# Patient Record
Sex: Female | Born: 1955 | Race: White | Hispanic: No | Marital: Married | Smoking: Never smoker
Health system: Southern US, Community
[De-identification: ages and names within clinical notes are randomized; demographics above are authoritative.]

## PROBLEM LIST (undated history)

## (undated) DIAGNOSIS — I10 Essential (primary) hypertension: Secondary | ICD-10-CM

## (undated) DIAGNOSIS — H539 Unspecified visual disturbance: Secondary | ICD-10-CM

## (undated) DIAGNOSIS — K219 Gastro-esophageal reflux disease without esophagitis: Secondary | ICD-10-CM

## (undated) DIAGNOSIS — E039 Hypothyroidism, unspecified: Secondary | ICD-10-CM

## (undated) DIAGNOSIS — M1990 Unspecified osteoarthritis, unspecified site: Secondary | ICD-10-CM

## (undated) HISTORY — DX: Unspecified visual disturbance: H53.9

## (undated) HISTORY — PX: TONSILLECTOMY: SUR1361

## (undated) HISTORY — PX: OTHER SURGICAL HISTORY: SHX169

---

## 1992-06-27 HISTORY — PX: MANDIBLE SURGERY: SHX707

## 2006-05-10 ENCOUNTER — Ambulatory Visit (HOSPITAL_COMMUNITY): Admission: RE | Admit: 2006-05-10 | Discharge: 2006-05-10 | Payer: Self-pay | Admitting: Obstetrics & Gynecology

## 2007-03-20 ENCOUNTER — Encounter (INDEPENDENT_AMBULATORY_CARE_PROVIDER_SITE_OTHER): Payer: Self-pay | Admitting: Obstetrics & Gynecology

## 2007-03-20 ENCOUNTER — Ambulatory Visit (HOSPITAL_COMMUNITY): Admission: RE | Admit: 2007-03-20 | Discharge: 2007-03-20 | Payer: Self-pay | Admitting: Obstetrics & Gynecology

## 2007-05-17 ENCOUNTER — Encounter: Admission: RE | Admit: 2007-05-17 | Discharge: 2007-05-17 | Payer: Self-pay | Admitting: Obstetrics & Gynecology

## 2007-05-30 ENCOUNTER — Encounter: Admission: RE | Admit: 2007-05-30 | Discharge: 2007-05-30 | Payer: Self-pay | Admitting: Obstetrics & Gynecology

## 2008-06-12 ENCOUNTER — Ambulatory Visit (HOSPITAL_COMMUNITY): Admission: RE | Admit: 2008-06-12 | Discharge: 2008-06-12 | Payer: Self-pay | Admitting: Obstetrics & Gynecology

## 2008-06-27 HISTORY — PX: THYROIDECTOMY, PARTIAL: SHX18

## 2008-10-16 ENCOUNTER — Other Ambulatory Visit: Admission: RE | Admit: 2008-10-16 | Discharge: 2008-10-16 | Payer: Self-pay | Admitting: Interventional Radiology

## 2008-10-16 ENCOUNTER — Encounter: Admission: RE | Admit: 2008-10-16 | Discharge: 2008-10-16 | Payer: Self-pay | Admitting: Otolaryngology

## 2008-10-16 ENCOUNTER — Encounter (INDEPENDENT_AMBULATORY_CARE_PROVIDER_SITE_OTHER): Payer: Self-pay | Admitting: Interventional Radiology

## 2009-01-09 ENCOUNTER — Ambulatory Visit (HOSPITAL_COMMUNITY): Admission: RE | Admit: 2009-01-09 | Discharge: 2009-01-10 | Payer: Self-pay | Admitting: Surgery

## 2009-01-09 ENCOUNTER — Encounter (INDEPENDENT_AMBULATORY_CARE_PROVIDER_SITE_OTHER): Payer: Self-pay | Admitting: Surgery

## 2009-06-02 ENCOUNTER — Encounter: Admission: RE | Admit: 2009-06-02 | Discharge: 2009-06-02 | Payer: Self-pay | Admitting: Surgery

## 2009-11-16 ENCOUNTER — Ambulatory Visit (HOSPITAL_COMMUNITY): Admission: RE | Admit: 2009-11-16 | Discharge: 2009-11-16 | Payer: Self-pay | Admitting: Obstetrics & Gynecology

## 2010-04-08 ENCOUNTER — Ambulatory Visit: Payer: Self-pay | Admitting: Emergency Medicine

## 2010-04-08 DIAGNOSIS — J019 Acute sinusitis, unspecified: Secondary | ICD-10-CM

## 2010-05-31 ENCOUNTER — Encounter: Admission: RE | Admit: 2010-05-31 | Discharge: 2010-05-31 | Payer: Self-pay | Admitting: Obstetrics and Gynecology

## 2010-07-18 ENCOUNTER — Encounter: Payer: Self-pay | Admitting: Obstetrics & Gynecology

## 2010-07-27 NOTE — Assessment & Plan Note (Signed)
Summary: POSSIBLE SINUS INFECTION   Vital Signs:  Patient Profile:   55 Years Old Female CC:      sinus pressure, HA, cough x  O2 treatment:    Room Air (left arm) Cuff size:   regular  Vitals Entered By: Lajean Saver RN (April 08, 2010 10:47 AM)                  Updated Prior Medication List: L-THYROXINE SODIUM  POWD (LEVOTHYROXINE SODIUM)  DAYQUIL MULTI-SYMPTOM 30-325-10 MG/15ML LIQD (PSEUDOEPHEDRINE-APAP-DM) prn NYQUIL 60-7.11-23-998 MG/30ML LIQD (PSEUDOEPH-DOXYLAMINE-DM-APAP)   Current Allergies: ! * CONTRAST DYEHistory of Present Illness Chief Complaint: sinus pressure, HA, cough x  History of Present Illness: Patient complains of onset of cold symptoms for 10 days.  They have been using Dayquil, Nyquil, and Sudafed PE which is helping a little bit.  She was getting better, then got worse again. No sore throat + cough No pleuritic pain No wheezing + nasal congestion + post-nasal drainage + sinus pain/pressure No itchy/red eyes No earache No hemoptysis No SOB + chills/sweats No fever + nausea No vomiting No abdominal pain No diarrhea No skin rashes No fatigue No myalgias No headache   REVIEW OF SYSTEMS Constitutional Symptoms       Complains of fatigue.  Ear/Nose/Throat/Mouth       Complains of frequent runny nose and sinus problems.  Respiratory       Complains of productive cough.     Gastrointestinal       Complains of nausea/vomiting.      Comments: no vomiting Neurological       Complains of headaches.    Past History:  Past Medical History: Unremarkable  Past Surgical History: Partial thyroidectomy Jaw Sx  Family History: None  Social History: Never Smoked Alcohol use-yes once weekly Drug use-no Smoking Status:  never Drug Use:  no Physical Exam General appearance: well developed, well nourished, no acute distress Head: maxillary sinus tenderness Nasal: clear discharge Oral/Pharynx: tongue normal, posterior pharynx  without erythema or exudate Neck: neck supple,  trachea midline, no masses Chest/Lungs: no rales, wheezes, or rhonchi bilateral, breath sounds equal without effort Heart: regular rate and  rhythm, no murmur Skin: no obvious rashes or lesions MSE: oriented to time, place, and person Assessment New Problems: SINUSITIS, ACUTE (ICD-461.9)   Patient Education: Patient and/or caregiver instructed in the following: rest, fluids, Tylenol prn. Sudafed 12-hour  Plan New Medications/Changes: AMOXICILLIN 875 MG TABS (AMOXICILLIN) 1 tab by mouth two times a day for 10 days  #20 x 0, 04/08/2010, Hoyt Koch MD  New Orders: New Patient Level II [phone] Planning Comments:   Hydration, nasal saline Follow-up with your primary care physician if not improving or if getting worse   The patient and/or caregiver has been counseled thoroughly with regard to medications prescribed including dosage, schedule, interactions, rationale for use, and possible side effects and they verbalize understanding.  Diagnoses and expected course of recovery discussed and will return if not improved as expected or if the condition worsens. Patient and/or caregiver verbalized understanding.  Prescriptions: AMOXICILLIN 875 MG TABS (AMOXICILLIN) 1 tab by mouth two times a day for 10 days  #20 x 0   Entered and Authorized by:   Hoyt Koch MD   Signed by:   Hoyt Koch MD on 04/08/2010   Method used:   Print then Give to Patient   RxID:   [id number]   Orders Added: 1)  New Patient Level II [27253]

## 2010-10-03 LAB — COMPREHENSIVE METABOLIC PANEL
ALT: 13 U/L (ref 0–35)
AST: 16 U/L (ref 0–37)
Albumin: 3.6 g/dL (ref 3.5–5.2)
Alkaline Phosphatase: 39 U/L (ref 39–117)
Calcium: 9.1 mg/dL (ref 8.4–10.5)
Potassium: 3.8 mEq/L (ref 3.5–5.1)
Sodium: 139 mEq/L (ref 135–145)
Total Protein: 6.2 g/dL (ref 6.0–8.3)

## 2010-10-03 LAB — CBC
HCT: 40.2 % (ref 36.0–46.0)
MCV: 91.3 fL (ref 78.0–100.0)
RBC: 4.4 MIL/uL (ref 3.87–5.11)
WBC: 4.8 10*3/uL (ref 4.0–10.5)

## 2010-10-03 LAB — DIFFERENTIAL
Basophils Relative: 0 % (ref 0–1)
Eosinophils Absolute: 0.1 10*3/uL (ref 0.0–0.7)
Lymphs Abs: 1.3 10*3/uL (ref 0.7–4.0)
Monocytes Absolute: 0.3 10*3/uL (ref 0.1–1.0)
Monocytes Relative: 7 % (ref 3–12)
Neutrophils Relative %: 64 % (ref 43–77)

## 2010-11-09 NOTE — Op Note (Signed)
Shelby Brandt                ACCOUNT NO.:  [id number]   MEDICAL RECORD NO.:  [id number]          PATIENT TYPE:  OIB   LOCATION:  1529                         FACILITY:  [facility]   PHYSICIAN:  Sandria Bales. Ezzard Standing, M.D.  DATE OF BIRTH:  1956-03-05   DATE OF PROCEDURE:  01/09/2009  DATE OF DISCHARGE:                               OPERATIVE REPORT   Date of Surgery ?   PREOPERATIVE DIAGNOSIS:  Right thyroid nodule approximately 3 cm in  diameter.   POSTOPERATIVE DIAGNOSIS:  Right thyroid nodule approximately 3 cm in  diameter.  Benign on frozen section.   PROCEDURE:  Right thyroid lobectomy.   SURGEON:  Ovidio Kin, MD.   FIRST ASSISTANT:  Avel Peace, MD.   ANESTHESIA:  General endotracheal.   ESTIMATED BLOOD LOSS:  Minimal.   INDICATIONS FOR PROCEDURE:  Shelby Brandt is a 55 year old white  female who has had a dominant right thyroid nodule that she tried  thyroid suppression, but there is some question of the nodule getting a  little bit bigger.  On a needle aspiration pathology saw follicular  cells and I discussed with the patient about proceeding with removing  this nodule.  Of note, she has a nodule on the opposite thyroid also  which was also biopsied, this nodule is smaller, so we talked about the  options of removing the right side only (unless malignant) or removing  both glands regardless of pathology.  She agreed that if the right  thyroid nodule was benign we would stop there, if it was malignant she  would need a total thyroidectomy.  We would follow the left thyroid  nodule, which remained, with annual ultrasound.   The potential complications of thyroid surgery include, but are not  limited to, bleeding, infection, recurrent laryngeal nerve injury, and,  if she has a total thyroidectomy, the possibility of parathyroid injury  and subsequent calcium problems.   OPERATIVE NOTE:  The patient was placed in the supine position, given a  general  endotracheal anesthetic.  Her neck was prepped with CHG and  sterilely draped.  A time out was held identifying the patient and the  procedure.   An incision was made approximately 2-1/2 cm above her sternum and sharp  dissection carried down, elevating her platysma muscle.  I then opened  her straps through the midline and elevated these off the right thyroid  gland.  There was an approximate 3 cm palpable mass in the mid to lower  pole of the right thyroid gland that was palpable.  There was a lot of  stickiness in the muscle and I think it was just from the prior  biopsies.   I first mobilized the upper pole.  I went around the superior thyroid  vessels, used 3-0 silk ties to tie the upper pole and I placed a clip.  I did this twice to mobilize the upper pole.  I identified a superior  parathyroid gland which was in the capsule of the thyroid.  I thought I  identified the gland and secured it safely, it dropped it away.  I then  took off the inferior thyroid vessels using primarily the Wave Harmonic  scalpel and Bovie cautery with occasional need of a clip or a tie.   After mobilizing up the right lobe out of the right tracheal groove, I  identified the recurrent laryngeal nerve which was posterior to this  dissection.  I carried the thyroid dissection up to the midline and  divided the isthmus.   I had a suture marking the superior pulse, sent this to pathology.  I  discussed with Shelby Brandt the findings at pathology.  He said this has a  benign appearance at this time, the final pathology pending.  I  discussed with him the left thyroid nodule and he thought that limiting  to the right lobe seemed reasonable.  As our original plan, with planned  followup just ultrasound left side.   The bleeding had been controlled with ties over the vessels and  controlled with a wave Harmonic scalpel.  I placed some Surgicel in the  bed of the right thyroid bed.  I irrigated the wound with  saline and  then closed the strap muscles in the midline with interrupted 3-0 Vicryl  sutures.  I closed the platysma with interrupted 3-0 Vicryl sutures, I  closed the skin with a 5-0 running Monocryl suture, painted the wound  with Tincture of Benzoin and steri-stripped it.   The patient tolerated the procedure well.  Final pathology is pending at  the time of this dictation.  Sponge and needle count were correct at the  end of the case.  Will plan to keep her overnight for observation.  Will  discharge tomorrow.      Sandria Bales. Ezzard Standing, M.D.  Electronically Signed     DHN/MEDQ  D:  01/09/2009  T:  01/09/2009  Job:  [id number]   cc:   Marjory Lies, M.D.  Fax: [phone]

## 2010-11-09 NOTE — Op Note (Signed)
NAMECIAIRA, Shelby Brandt NO.:  [id number]   MEDICAL RECORD NO.:  [id number]          PATIENT TYPE:  AMB   LOCATION:  SDC                           FACILITY:  [facility]   PHYSICIAN:  Ilda Mori, M.D.   DATE OF BIRTH:  June 14, 1956   DATE OF PROCEDURE:  03/20/2007  DATE OF DISCHARGE:                               OPERATIVE REPORT   PREOPERATIVE DIAGNOSIS:  Menorrhagia and endometrial polyp.   POSTOPERATIVE DIAGNOSIS:  Menorrhagia and endometrial polyp.   PROCEDURE:  Hysteroscopy, polypectomy and NovaSure endometrial ablation.   SURGEON:  Dr. Ilda Mori.   ANESTHESIA:  Was MAC with paracervical block.   FINDINGS:  Was a 2-cm endometrial polyp.   COMPLICATIONS:  Were none.   ESTIMATED BLOOD LOSS:  Was minimal.   INDICATIONS:  A 55 year old female with a history of heavy vaginal  bleeding.  Office evaluation revealed a 2-cm polyp in the endometrial  cavity.  These findings were discussed with the patient, and decision  was made to proceed with hysteroscopy and polypectomy followed by  NovaSure endometrial ablation.   PROCEDURE:  The patient was taken to the operating room and placed in a  dorsal lithotomy position.  IV sedation was administered, and 10 mL of  2% lidocaine was infiltrated in the paracervical tissues.  The cervix  was sounded with a Hagar dilator and was approximately 3.2 cm.  The  endometrial cavity was sounded to 9.2 cm.  The endocervical os was then  dilated with Shawnie Pons dilators to 25-French.  A diagnostic hysteroscope was  introduced into the endometrial cavity, and a 2-cm polyp was noted to be  present.  No other abnormalities were noted.  The hysteroscope was  removed.  The polyp forceps were introduced, and the polyp was grasped  and removed.  The hysteroscope was reintroduced to confirm that the  polyp had been removed.  At this point, the NovaSure device was  introduced into the endometrial cavity and deployed with a width of 4.8  cm.   An ablation was then carried out at a power of 158 watts for  approximately 58 seconds.  At the end of the procedure, the hysteroscope  was reintroduced, and the entire endometrial cavity was seen to be  ablated.  The procedure was then terminated, and the patient left the  operating room in good condition.      Ilda Mori, M.D.  Electronically Signed    RK/MEDQ  D:  03/20/2007  T:  03/20/2007  Job:  [id number]

## 2010-11-10 ENCOUNTER — Other Ambulatory Visit (HOSPITAL_COMMUNITY): Payer: Self-pay | Admitting: Obstetrics & Gynecology

## 2010-11-10 DIAGNOSIS — Z1231 Encounter for screening mammogram for malignant neoplasm of breast: Secondary | ICD-10-CM

## 2010-11-23 ENCOUNTER — Inpatient Hospital Stay (INDEPENDENT_AMBULATORY_CARE_PROVIDER_SITE_OTHER)
Admission: RE | Admit: 2010-11-23 | Discharge: 2010-11-23 | Disposition: A | Discharge status: Home / Self Care | Payer: PRIVATE HEALTH INSURANCE | Source: Ambulatory Visit | Attending: Emergency Medicine | Admitting: Emergency Medicine

## 2010-11-23 ENCOUNTER — Encounter: Payer: Self-pay | Admitting: Emergency Medicine

## 2010-11-23 DIAGNOSIS — J069 Acute upper respiratory infection, unspecified: Secondary | ICD-10-CM

## 2010-11-23 LAB — CONVERTED CEMR LAB: Rapid Strep: NEGATIVE

## 2010-11-25 ENCOUNTER — Ambulatory Visit (HOSPITAL_COMMUNITY)
Admission: RE | Admit: 2010-11-25 | Discharge: 2010-11-25 | Disposition: A | Discharge status: Home / Self Care | Payer: PRIVATE HEALTH INSURANCE | Source: Ambulatory Visit | Attending: Obstetrics & Gynecology | Admitting: Obstetrics & Gynecology

## 2010-11-25 DIAGNOSIS — Z1231 Encounter for screening mammogram for malignant neoplasm of breast: Secondary | ICD-10-CM

## 2010-12-08 ENCOUNTER — Other Ambulatory Visit: Payer: Self-pay | Admitting: Obstetrics & Gynecology

## 2011-04-07 LAB — CBC
HCT: 36
MCHC: 35.6
MCV: 90.1
Platelets: 221
WBC: 4.7

## 2011-05-30 NOTE — Progress Notes (Signed)
Summary: COUGH,CONGESTION,SORE THROAT/TJ rm 5   Vital Signs:  Patient Profile:   55 Years Old Female CC:      dry cough, RT ear ache, and sore throat x 5 days Weight:      171.50 pounds O2 Sat:      99 % O2 treatment:    Room Air Temp:     98.1 degrees F oral Pulse rate:   74 / minute Resp:     16 per minute BP sitting:   111 / 80  (left arm) Cuff size:   regular  Vitals Entered By: Clemens Catholic LPN (Nov 23, 2010 7:00 PM)                  Updated Prior Medication List: L-THYROXINE SODIUM  POWD (LEVOTHYROXINE SODIUM)  FLONASE 50 MCG/ACT SUSP (FLUTICASONE PROPIONATE)   Current Allergies (reviewed today): ! * CONTRAST DYEHistory of Present Illness History from: patient Chief Complaint: dry cough, RT ear ache, and sore throat x 5 days History of Present Illness: 55 Years Old Female complains of onset of cold symptoms for 7 days.  Cena has been using Sudafed and OTC cough meds which is helping a little bit.  She has been feeling worse the last few days. + sore throat + cough + hoarseness No pleuritic pain No wheezing + nasal congestion + post-nasal drainage + sinus pain/pressure No chest congestion No itchy/red eyes + earache No hemoptysis No SOB No chills/sweats No fever No nausea No vomiting No abdominal pain No diarrhea No skin rashes + fatigue No myalgias + headache   REVIEW OF SYSTEMS Constitutional Symptoms      Denies fever, chills, night sweats, weight loss, weight gain, and fatigue.  Eyes       Complains of contact lenses.      Denies change in vision, eye pain, eye discharge, glasses, and eye surgery. Ear/Nose/Throat/Mouth       Complains of sore throat and hoarseness.      Denies hearing loss/aids, change in hearing, ear pain, ear discharge, dizziness, frequent runny nose, frequent nose bleeds, sinus problems, and tooth pain or bleeding.  Respiratory       Complains of dry cough.      Denies productive cough, wheezing, shortness of breath,  asthma, bronchitis, and emphysema/COPD.  Cardiovascular       Denies murmurs, chest pain, and tires easily with exhertion.    Gastrointestinal       Denies stomach pain, nausea/vomiting, diarrhea, constipation, blood in bowel movements, and indigestion. Genitourniary       Denies painful urination, kidney stones, and loss of urinary control. Neurological       Complains of headaches.      Denies paralysis, seizures, and fainting/blackouts. Musculoskeletal       Denies muscle pain, joint pain, joint stiffness, decreased range of motion, redness, swelling, muscle weakness, and gout.  Skin       Denies bruising, unusual mles/lumps or sores, and hair/skin or nail changes.  Psych       Denies mood changes, temper/anger issues, anxiety/stress, speech problems, depression, and sleep problems. Other Comments: pt c/o dry cough, sore throat, and RT ear ache x 5days. no fever. she has taken Sudafed, IBF and Robt DM with no relief.   Past History:  Past Medical History: Reviewed history from 04/08/2010 and no changes required. Unremarkable  Past Surgical History: Reviewed history from 04/08/2010 and no changes required. Partial thyroidectomy Jaw Sx  Family History: Reviewed history from 04/08/2010  and no changes required. None  Social History: Reviewed history from 04/08/2010 and no changes required. Never Smoked Alcohol use-yes once weekly Drug use-no Physical Exam General appearance: well developed, well nourished, hoarse Ears: normal, no lesions or deformities Nasal: mucosa pink, nonedematous, no septal deviation, turbinates normal Oral/Pharynx: pharyngeal erythema without exudate, uvula midline without deviation Chest/Lungs: no rales, wheezes, or rhonchi bilateral, breath sounds equal without effort Heart: regular rate and  rhythm, no murmur MSE: oriented to time, place, and person Assessment New Problems: UPPER RESPIRATORY INFECTION, ACUTE (ICD-465.9)   Plan New  Medications/Changes: AMOXICILLIN 875 MG TABS (AMOXICILLIN) 1 by mouth two times a day for 7 days  #14 x 0, 11/23/2010, Hoyt Koch MD  Planning Comments:   1)  Take the prescribed antibiotic as instructed. 2)  Use nasal saline solution (over the counter) at least 3 times a day. 3)  Use over the counter decongestants like Zyrtec-D every 12 hours as needed to help with congestion. 4)  Can take tylenol every 6 hours or motrin every 8 hours for pain or fever. 5)  Follow up with your primary doctor  if no improvement in 5-7 days, sooner if increasing pain, fever, or new symptoms.    The patient and/or caregiver has been counseled thoroughly with regard to medications prescribed including dosage, schedule, interactions, rationale for use, and possible side effects and they verbalize understanding.  Diagnoses and expected course of recovery discussed and will return if not improved as expected or if the condition worsens. Patient and/or caregiver verbalized understanding.  Prescriptions: AMOXICILLIN 875 MG TABS (AMOXICILLIN) 1 by mouth two times a day for 7 days  #14 x 0   Entered and Authorized by:   Hoyt Koch MD   Signed by:   Hoyt Koch MD on 11/23/2010   Method used:   Print then Give to Patient   RxID:   [id number]     Laboratory Results  Date/Time Received: Nov 23, 2010 7:04 PM  Date/Time Reported: Nov 23, 2010 7:04 PM   Other Tests  Rapid Strep: negative  Kit Test Internal QC: Negative   (Normal Range: Negative)

## 2012-03-12 ENCOUNTER — Other Ambulatory Visit (HOSPITAL_COMMUNITY): Payer: Self-pay | Admitting: Obstetrics & Gynecology

## 2012-03-12 DIAGNOSIS — Z1231 Encounter for screening mammogram for malignant neoplasm of breast: Secondary | ICD-10-CM

## 2012-03-22 ENCOUNTER — Ambulatory Visit (HOSPITAL_COMMUNITY)
Admission: RE | Admit: 2012-03-22 | Discharge: 2012-03-22 | Disposition: A | Discharge status: Home / Self Care | Payer: PRIVATE HEALTH INSURANCE | Source: Ambulatory Visit | Attending: Obstetrics & Gynecology | Admitting: Obstetrics & Gynecology

## 2012-03-22 DIAGNOSIS — Z1231 Encounter for screening mammogram for malignant neoplasm of breast: Secondary | ICD-10-CM

## 2012-04-19 ENCOUNTER — Telehealth (INDEPENDENT_AMBULATORY_CARE_PROVIDER_SITE_OTHER): Payer: Self-pay

## 2012-04-19 NOTE — Telephone Encounter (Signed)
I reviewed the patient's old chart and Dr Ezzard Standing saw her in January of 2012.  He made that her last visit.  He stated she had stable ultrasounds in 2010 and 2011.  I advised the pt and said she does not need any follow up at this time.

## 2012-04-19 NOTE — Telephone Encounter (Signed)
Message copied by Ivory Broad on Thu Apr 19, 2012  2:31 PM ------      Message from: Rise Paganini      Created: Thu Apr 19, 2012  2:03 PM      Regarding: Lemmie Evens: [phone]       Patient stated that she will would like to know when she should be scheduled for an ultrasound concerning her thyroid. This information was recommended by her primary physician because it seems that she may have missed the time frame of a follow-up. Please call to discuss. Thank you.

## 2012-04-24 ENCOUNTER — Other Ambulatory Visit: Payer: Self-pay | Admitting: Family Medicine

## 2012-04-24 DIAGNOSIS — E041 Nontoxic single thyroid nodule: Secondary | ICD-10-CM

## 2012-05-01 ENCOUNTER — Other Ambulatory Visit: Payer: PRIVATE HEALTH INSURANCE

## 2012-05-08 ENCOUNTER — Other Ambulatory Visit: Payer: PRIVATE HEALTH INSURANCE

## 2012-05-15 ENCOUNTER — Ambulatory Visit
Admission: RE | Admit: 2012-05-15 | Discharge: 2012-05-15 | Disposition: A | Discharge status: Home / Self Care | Payer: PRIVATE HEALTH INSURANCE | Source: Ambulatory Visit | Attending: Family Medicine | Admitting: Family Medicine

## 2012-05-15 DIAGNOSIS — E041 Nontoxic single thyroid nodule: Secondary | ICD-10-CM

## 2012-07-24 ENCOUNTER — Other Ambulatory Visit: Payer: Self-pay | Admitting: Family Medicine

## 2012-07-24 DIAGNOSIS — R319 Hematuria, unspecified: Secondary | ICD-10-CM

## 2012-07-24 DIAGNOSIS — R109 Unspecified abdominal pain: Secondary | ICD-10-CM

## 2012-07-26 ENCOUNTER — Other Ambulatory Visit: Payer: PRIVATE HEALTH INSURANCE

## 2012-07-27 ENCOUNTER — Ambulatory Visit
Admission: RE | Admit: 2012-07-27 | Discharge: 2012-07-27 | Disposition: A | Discharge status: Home / Self Care | Payer: PRIVATE HEALTH INSURANCE | Source: Ambulatory Visit | Attending: Family Medicine | Admitting: Family Medicine

## 2012-07-27 DIAGNOSIS — R319 Hematuria, unspecified: Secondary | ICD-10-CM

## 2012-07-27 DIAGNOSIS — R109 Unspecified abdominal pain: Secondary | ICD-10-CM

## 2012-10-22 ENCOUNTER — Other Ambulatory Visit: Payer: Self-pay | Admitting: Orthopedic Surgery

## 2012-10-22 ENCOUNTER — Encounter (HOSPITAL_COMMUNITY): Payer: Self-pay | Admitting: Pharmacy Technician

## 2012-10-23 ENCOUNTER — Encounter (HOSPITAL_COMMUNITY): Payer: Self-pay

## 2012-10-23 ENCOUNTER — Other Ambulatory Visit (HOSPITAL_COMMUNITY): Payer: Self-pay | Admitting: *Deleted

## 2012-10-23 ENCOUNTER — Encounter (HOSPITAL_COMMUNITY)
Admission: RE | Admit: 2012-10-23 | Discharge: 2012-10-23 | Disposition: A | Discharge status: Home / Self Care | Payer: PRIVATE HEALTH INSURANCE | Source: Ambulatory Visit | Attending: Specialist | Admitting: Specialist

## 2012-10-23 HISTORY — DX: Hypothyroidism, unspecified: E03.9

## 2012-10-23 LAB — CBC
HCT: 40.4 % (ref 36.0–46.0)
Hemoglobin: 14.1 g/dL (ref 12.0–15.0)
RBC: 4.6 MIL/uL (ref 3.87–5.11)
WBC: 5.4 10*3/uL (ref 4.0–10.5)

## 2012-10-23 LAB — SURGICAL PCR SCREEN: Staphylococcus aureus: POSITIVE — AB

## 2012-10-23 NOTE — Patient Instructions (Signed)
20      Your procedure is scheduled on:  Friday 10/26/2012  Report to [facility] at 0630  AM.  Call this number if you have problems the morning of surgery: [phone]   Remember:             IF YOU USE CPAP,BRING MASK AND TUBING AM OF SURGERY!   Do not eat food or drink liquids AFTER MIDNIGHT!  Take these medicines the morning of surgery with A SIP OF WATER: Levothyroxine, use Flonase nasal spray   Do not bring valuables to the hospital.  .  Leave suitcase in the car. After surgery it may be brought to your room.  For patients admitted to the hospital, checkout time is 11:00 AM the day of              Discharge.    DO NOT WEAR JEWELRY , MAKE-UP, LOTIONS,POWDERS,PERFUMES!             WOMEN -DO NOT SHAVE LEGS OR UNDERARMS 12 HRS. BEFORE SURGERY!               MEN MAY SHAVE AS USUAL!             CONTACTS,DENTURES OR BRIDGEWORK, FALSE EYELASHES MAY  NOT BE WORN INTO SURGERY!                                           Patients discharged the day of surgery will not be allowed to drive home.If going home the same day of surgery, must have someone stay with you first 24 hrs.at home and arrange for someone to drive you home from the Hospital.                         YOUR DRIVER IS:Tim-spouse   Special Instructions:             Please read over the following fact sheets that you were given:             1. [facility] PREPARING FOR SURGERY SHEET              2.[facility] INFORMATION              3.[facility]                                        Telford Nab.Terri Rorrer,RN,BSN     [phone]                FAILURE TO FOLLOW THESE INSTRUCTIONS MAY RESULT IN CANCELLATION OF YOUR SURGERY!               Patient Signature:___________________________

## 2012-10-24 ENCOUNTER — Other Ambulatory Visit: Payer: Self-pay | Admitting: Orthopedic Surgery

## 2012-10-25 ENCOUNTER — Other Ambulatory Visit: Payer: Self-pay | Admitting: Orthopedic Surgery

## 2012-10-25 NOTE — Pre-Procedure Instructions (Signed)
Called patient on cell # [phone] and left message of time change for surgery. Patient to report to Short Stay on 10/26/2012 at 0630 am. Left message to call [phone] if have any questions.

## 2012-10-25 NOTE — H&P (Signed)
Shelby Brandt is an 56 y.o. female.   Chief Complaint: right knee pain HPI: c/o R knee pain x 2 months (but worsening x 4 weeks). Symptoms reported today include: pain, swelling, giving way, instability and pain with weightbearing. The patient feels that they are doing poorly. Pain refractory to activity modification, conservative measures, Cortisone injections, ice, NSAIDs, rest. She has tried working on some stretching and quad strengthening with no relief. Now with episodes of instability and giving way.  Past Medical History  Diagnosis Date  . Hypothyroidism     Past Surgical History  Procedure Laterality Date  . Thyroidectomy, partial  2010    right  . Tonsillectomy      as adult  . Mandible surgery  1994    and upper jaw    No family history on file. Social History:  reports that  drinks alcohol. She reports that she does not use illicit drugs. Her tobacco history is not on file.  Allergies:  Allergies  Allergen Reactions  . Ivp Dye (Iodinated Diagnostic Agents) Hives    respiratory problems      (Not in a hospital admission)  No results found for this or any previous visit (from the past 48 hour(s)). No results found.  Review of Systems  Constitutional: Negative.   HENT: Negative.   Eyes: Negative.   Respiratory: Negative.   Cardiovascular: Negative.   Gastrointestinal: Negative.   Genitourinary: Negative.   Musculoskeletal: Positive for back pain and joint pain.  Skin: Negative.   Neurological: Negative.   Psychiatric/Behavioral: Negative.     There were no vitals taken for this visit. Physical Exam  Constitutional: She is oriented to person, place, and time. She appears well-developed and well-nourished. She appears distressed.  HENT:  Head: Normocephalic and atraumatic.  Eyes: Conjunctivae and EOM are normal. Pupils are equal, round, and reactive to light.  Neck: Normal range of motion. Neck supple.  Cardiovascular: Normal rate and regular rhythm.    Respiratory: Effort normal and breath sounds normal.  GI: Soft. Bowel sounds are normal.  Musculoskeletal:  Posterior knee tender to palpation, mid 1/3 of the medial joint line tender to palpation and posterior 1/3 of the medial joint line tender to palpation. Trace effusion. Mild patellofemoral crepitus. Sensation intact to light touch. Skin- no ecchymosis and no erythema. Strength and Tone:Quadriceps- 5/5. Hamstrings- 5/5. AROM:5-100. Passively can extend to 0  with pain. No instability. Equivocal [place]. Antalgic gait.  Neurological: She is alert and oriented to person, place, and time. She has normal reflexes.  Skin: Skin is warm and dry.  Psychiatric: She has a normal mood and affect.    MRI R knee images and report reviewed today by Dr. Beane with degeneration and partial avulsion posterior root medial meniscus with mild peripheral extrusion and small horizontal tear of the body. 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 Tibial ganglion 1.3cm at attachment of posterior root medial meniscus. Small effusion.  Assessment/Plan R knee MMT  Pt with progressive pain, swelling, mechanical symptoms, ambulatory dysfunction, MRI with meniscal tear, symptoms also suspicious for chondral flap tear. Refractory to rest, activity modification, HEP, NSAIDs, ice/elevation, intra-articular steroid injection. At this point, recommend proceeding with R knee arthroscopy, partial medial meniscectomy. Discussed the procedure itself as well as risks, complications, and alternatives including but not limited to DVT, PE, infx, bleeding, failure of procedure, need for secondary procedure, anesthesia risk, even death. All questions answered. Discussed post-op course. Pt desires to proceed. Will schedule for R knee arthroscopy.     BISSELL, JACLYN M. for Dr. Beane 10/25/2012, 9:54 PM    

## 2012-10-26 ENCOUNTER — Encounter (HOSPITAL_COMMUNITY)
Admission: RE | Disposition: A | Discharge status: Home / Self Care | Payer: Self-pay | Source: Ambulatory Visit | Attending: Specialist

## 2012-10-26 ENCOUNTER — Ambulatory Visit (HOSPITAL_COMMUNITY)
Admission: RE | Admit: 2012-10-26 | Discharge: 2012-10-26 | Disposition: A | Discharge status: Home / Self Care | Payer: PRIVATE HEALTH INSURANCE | Source: Ambulatory Visit | Attending: Specialist | Admitting: Specialist

## 2012-10-26 ENCOUNTER — Encounter (HOSPITAL_COMMUNITY): Payer: Self-pay | Admitting: Anesthesiology

## 2012-10-26 ENCOUNTER — Ambulatory Visit (HOSPITAL_COMMUNITY): Payer: PRIVATE HEALTH INSURANCE | Admitting: Anesthesiology

## 2012-10-26 ENCOUNTER — Encounter (HOSPITAL_COMMUNITY): Payer: Self-pay | Admitting: *Deleted

## 2012-10-26 DIAGNOSIS — IMO0002 Reserved for concepts with insufficient information to code with codable children: Secondary | ICD-10-CM | POA: Insufficient documentation

## 2012-10-26 DIAGNOSIS — E890 Postprocedural hypothyroidism: Secondary | ICD-10-CM | POA: Insufficient documentation

## 2012-10-26 DIAGNOSIS — S83249A Other tear of medial meniscus, current injury, unspecified knee, initial encounter: Secondary | ICD-10-CM

## 2012-10-26 DIAGNOSIS — X58XXXA Exposure to other specified factors, initial encounter: Secondary | ICD-10-CM | POA: Insufficient documentation

## 2012-10-26 DIAGNOSIS — M1710 Unilateral primary osteoarthritis, unspecified knee: Secondary | ICD-10-CM | POA: Insufficient documentation

## 2012-10-26 DIAGNOSIS — S83289A Other tear of lateral meniscus, current injury, unspecified knee, initial encounter: Secondary | ICD-10-CM | POA: Insufficient documentation

## 2012-10-26 DIAGNOSIS — S83241S Other tear of medial meniscus, current injury, right knee, sequela: Secondary | ICD-10-CM

## 2012-10-26 DIAGNOSIS — Z91041 Radiographic dye allergy status: Secondary | ICD-10-CM | POA: Insufficient documentation

## 2012-10-26 HISTORY — PX: KNEE ARTHROSCOPY: SHX127

## 2012-10-26 SURGERY — ARTHROSCOPY, KNEE
Anesthesia: General | Site: Knee | Laterality: Right | Wound class: Clean

## 2012-10-26 MED ORDER — FENTANYL CITRATE 0.05 MG/ML IJ SOLN
INTRAMUSCULAR | Status: DC | PRN
  Administered 2012-10-26: 100 ug via INTRAVENOUS

## 2012-10-26 MED ORDER — EPINEPHRINE HCL 1 MG/ML IJ SOLN
INTRAMUSCULAR | Status: AC
  Filled 2012-10-26: qty 2

## 2012-10-26 MED ORDER — METOCLOPRAMIDE HCL 5 MG/ML IJ SOLN
INTRAMUSCULAR | Status: DC | PRN
  Administered 2012-10-26: 10 mg via INTRAVENOUS

## 2012-10-26 MED ORDER — DEXAMETHASONE SODIUM PHOSPHATE 4 MG/ML IJ SOLN
INTRAMUSCULAR | Status: DC | PRN
  Administered 2012-10-26: 8 mg via INTRAVENOUS

## 2012-10-26 MED ORDER — CEFAZOLIN SODIUM-DEXTROSE 2-3 GM-% IV SOLR
2.0000 g | INTRAVENOUS | Status: AC
  Administered 2012-10-26: 2 g via INTRAVENOUS

## 2012-10-26 MED ORDER — PROMETHAZINE HCL 25 MG/ML IJ SOLN: 6.2500 mg | INTRAMUSCULAR | Status: DC | PRN

## 2012-10-26 MED ORDER — CEFAZOLIN SODIUM-DEXTROSE 2-3 GM-% IV SOLR
INTRAVENOUS | Status: AC
  Filled 2012-10-26: qty 50

## 2012-10-26 MED ORDER — NEOSTIGMINE METHYLSULFATE 1 MG/ML IJ SOLN
INTRAMUSCULAR | Status: DC | PRN
  Administered 2012-10-26: 5 mg via INTRAVENOUS

## 2012-10-26 MED ORDER — LACTATED RINGERS IR SOLN
Status: DC | PRN
  Administered 2012-10-26: 6000 mL

## 2012-10-26 MED ORDER — MIDAZOLAM HCL 5 MG/5ML IJ SOLN
INTRAMUSCULAR | Status: DC | PRN
  Administered 2012-10-26: 2 mg via INTRAVENOUS

## 2012-10-26 MED ORDER — ACETAMINOPHEN 10 MG/ML IV SOLN
INTRAVENOUS | Status: AC
  Filled 2012-10-26: qty 100

## 2012-10-26 MED ORDER — ONDANSETRON HCL 4 MG/2ML IJ SOLN
INTRAMUSCULAR | Status: DC | PRN
  Administered 2012-10-26: 4 mg via INTRAVENOUS

## 2012-10-26 MED ORDER — LACTATED RINGERS IV SOLN
INTRAVENOUS | Status: DC
  Administered 2012-10-26: 1000 mL via INTRAVENOUS

## 2012-10-26 MED ORDER — HYDROMORPHONE HCL PF 1 MG/ML IJ SOLN: 0.2500 mg | INTRAMUSCULAR | Status: DC | PRN

## 2012-10-26 MED ORDER — EPINEPHRINE HCL 1 MG/ML IJ SOLN
INTRAMUSCULAR | Status: DC | PRN
  Administered 2012-10-26: 2 mL

## 2012-10-26 MED ORDER — GLYCOPYRROLATE 0.2 MG/ML IJ SOLN
INTRAMUSCULAR | Status: DC | PRN
  Administered 2012-10-26: .6 mg via INTRAVENOUS

## 2012-10-26 MED ORDER — HYDROCODONE-ACETAMINOPHEN 5-325 MG PO TABS: 1.0000 | ORAL_TABLET | Freq: Four times a day (QID) | ORAL | Status: DC | PRN

## 2012-10-26 MED ORDER — CISATRACURIUM BESYLATE (PF) 10 MG/5ML IV SOLN
INTRAVENOUS | Status: DC | PRN
  Administered 2012-10-26: 6 mg via INTRAVENOUS

## 2012-10-26 MED ORDER — CLINDAMYCIN PHOSPHATE 900 MG/50ML IV SOLN
900.0000 mg | INTRAVENOUS | Status: AC
  Administered 2012-10-26: 900 mg via INTRAVENOUS

## 2012-10-26 MED ORDER — KETOROLAC TROMETHAMINE 30 MG/ML IJ SOLN
INTRAMUSCULAR | Status: AC
  Filled 2012-10-26: qty 1

## 2012-10-26 MED ORDER — LIDOCAINE HCL (CARDIAC) 20 MG/ML IV SOLN
INTRAVENOUS | Status: DC | PRN
  Administered 2012-10-26: 75 mg via INTRAVENOUS

## 2012-10-26 MED ORDER — PROPOFOL 10 MG/ML IV EMUL
INTRAVENOUS | Status: DC | PRN
  Administered 2012-10-26: 200 mg via INTRAVENOUS

## 2012-10-26 MED ORDER — BUPIVACAINE-EPINEPHRINE (PF) 0.5% -1:200000 IJ SOLN
INTRAMUSCULAR | Status: AC
  Filled 2012-10-26: qty 10

## 2012-10-26 MED ORDER — LACTATED RINGERS IV SOLN: INTRAVENOUS | Status: DC

## 2012-10-26 MED ORDER — CLINDAMYCIN PHOSPHATE 900 MG/50ML IV SOLN
INTRAVENOUS | Status: AC
  Filled 2012-10-26: qty 50

## 2012-10-26 MED ORDER — KETOROLAC TROMETHAMINE 30 MG/ML IJ SOLN
30.0000 mg | Freq: Once | INTRAMUSCULAR | Status: AC
  Administered 2012-10-26: 30 mg via INTRAVENOUS

## 2012-10-26 MED ORDER — BUPIVACAINE-EPINEPHRINE 0.5% -1:200000 IJ SOLN
INTRAMUSCULAR | Status: DC | PRN
  Administered 2012-10-26: 15 mL

## 2012-10-26 SURGICAL SUPPLY — 21 items
BLADE 4.2CUDA (BLADE) IMPLANT
BLADE CUDA SHAVER 3.5 (BLADE) ×2 IMPLANT
CLOTH 2% CHLOROHEXIDINE 3PK (PERSONAL CARE ITEMS) ×2 IMPLANT
CLOTH BEACON ORANGE TIMEOUT ST (SAFETY) ×2 IMPLANT
DRSG EMULSION OIL 3X3 NADH (GAUZE/BANDAGES/DRESSINGS) ×2 IMPLANT
DURAPREP 26ML APPLICATOR (WOUND CARE) ×2 IMPLANT
GLOVE BIOGEL PI IND STRL 7.5 (GLOVE) ×1 IMPLANT
GLOVE BIOGEL PI IND STRL 8 (GLOVE) ×1 IMPLANT
GLOVE BIOGEL PI INDICATOR 7.5 (GLOVE) ×1
GLOVE BIOGEL PI INDICATOR 8 (GLOVE) ×1
GLOVE SURG SS PI 7.5 STRL IVOR (GLOVE) ×2 IMPLANT
GLOVE SURG SS PI 8.0 STRL IVOR (GLOVE) ×3 IMPLANT
GOWN PREVENTION PLUS LG XLONG (DISPOSABLE) ×1 IMPLANT
GOWN STRL REIN XL XLG (GOWN DISPOSABLE) ×5 IMPLANT
MANIFOLD NEPTUNE II (INSTRUMENTS) ×3 IMPLANT
PACK ARTHROSCOPY WL (CUSTOM PROCEDURE TRAY) ×2 IMPLANT
PADDING CAST COTTON 6X4 STRL (CAST SUPPLIES) ×2 IMPLANT
SET ARTHROSCOPY TUBING (MISCELLANEOUS) ×2
SET ARTHROSCOPY TUBING LN (MISCELLANEOUS) ×1 IMPLANT
SUT ETHILON 4 0 PS 2 18 (SUTURE) ×2 IMPLANT
WRAP KNEE MAXI GEL POST OP (GAUZE/BANDAGES/DRESSINGS) ×2 IMPLANT

## 2012-10-26 NOTE — Anesthesia Preprocedure Evaluation (Signed)
Anesthesia Evaluation  Patient identified by MRN, date of birth, ID band Patient awake    Reviewed: Allergy & Precautions, H&P , NPO status , Patient's Chart, lab work & pertinent test results  Airway Mallampati: II TM Distance: >3 FB Neck ROM: Full    Dental  (+) Teeth Intact and Dental Advisory Given   Pulmonary neg pulmonary ROS,  breath sounds clear to auscultation  Pulmonary exam normal       Cardiovascular negative cardio ROS  Rhythm:Regular Rate:Normal     Neuro/Psych negative neurological ROS  negative psych ROS   GI/Hepatic negative GI ROS, Neg liver ROS,   Endo/Other  negative endocrine ROSHypothyroidism   Renal/GU negative Renal ROS  negative genitourinary   Musculoskeletal negative musculoskeletal ROS (+)   Abdominal   Peds negative pediatric ROS (+)  Hematology negative hematology ROS (+)   Anesthesia Other Findings   Reproductive/Obstetrics negative OB ROS                           Anesthesia Physical Anesthesia Plan  ASA: I  Anesthesia Plan: General   Post-op Pain Management:    Induction: Intravenous  Airway Management Planned: LMA  Additional Equipment:   Intra-op Plan:   Post-operative Plan: Extubation in OR  Informed Consent: I have reviewed the patients History and Physical, chart, labs and discussed the procedure including the risks, benefits and alternatives for the proposed anesthesia with the patient or authorized representative who has indicated his/her understanding and acceptance.   Dental advisory given  Plan Discussed with: CRNA  Anesthesia Plan Comments:         Anesthesia Quick Evaluation

## 2012-10-26 NOTE — Brief Op Note (Signed)
10/26/2012  10:04 AM  PATIENT:  Shelby Brandt  57 y.o. female  PRE-OPERATIVE DIAGNOSIS:  RIGHT KNEE MEDIAL MENISCUS TEAR  POST-OPERATIVE DIAGNOSIS:  RIGHT KNEE MEDIAL AND LATERAL  MENISCUS TEAR  PROCEDURE:  Procedure(s): RIGHT KNEE ARTHROSCOPY WITH PARTIAL MEDIAL AND LATERAL  MENISECTOMY, DEBRIDEMENT (Right)  SURGEON:  Surgeon(s) and Role:    * Javier Docker, MD - Primary  PHYSICIAN ASSISTANT:   ASSISTANTS: Dawayne Cirri ANESTHESIA:   general  EBL:  Total I/O In: 1000 [I.V.:1000] Out: 250 [Other:250]  BLOOD ADMINISTERED:none  DRAINS: none   LOCAL MEDICATIONS USED:  MARCAINE     SPECIMEN:  No Specimen  DISPOSITION OF SPECIMEN:  N/A  COUNTS:  YES  TOURNIQUET:  * No tourniquets in log *  DICTATION: .Other Dictation: Dictation Number [id number]  PLAN OF CARE: Discharge to home after PACU  PATIENT DISPOSITION:  PACU - hemodynamically stable.   Delay start of Pharmacological VTE agent (>24hrs) due to surgical blood loss or risk of bleeding: no

## 2012-10-26 NOTE — Preoperative (Signed)
Beta Blockers   Reason not to administer Beta Blockers:Not Applicable 

## 2012-10-26 NOTE — H&P (View-Only) (Signed)
Shelby Brandt is an 57 y.o. female.   Chief Complaint: right knee pain HPI: c/o R knee pain x 2 months (but worsening x 4 weeks). Symptoms reported today include: pain, swelling, giving way, instability and pain with weightbearing. The patient feels that they are doing poorly. Pain refractory to activity modification, conservative measures, Cortisone injections, ice, NSAIDs, rest. She has tried working on some stretching and quad strengthening with no relief. Now with episodes of instability and giving way.  Past Medical History  Diagnosis Date  . Hypothyroidism     Past Surgical History  Procedure Laterality Date  . Thyroidectomy, partial  2010    right  . Tonsillectomy      as adult  . Mandible surgery  1994    and upper jaw    No family history on file. Social History:  reports that  drinks alcohol. She reports that she does not use illicit drugs. Her tobacco history is not on file.  Allergies:  Allergies  Allergen Reactions  . Ivp Dye (Iodinated Diagnostic Agents) Hives    respiratory problems      (Not in a hospital admission)  No results found for this or any previous visit (from the past 48 hour(s)). No results found.  Review of Systems  Constitutional: Negative.   HENT: Negative.   Eyes: Negative.   Respiratory: Negative.   Cardiovascular: Negative.   Gastrointestinal: Negative.   Genitourinary: Negative.   Musculoskeletal: Positive for back pain and joint pain.  Skin: Negative.   Neurological: Negative.   Psychiatric/Behavioral: Negative.     There were no vitals taken for this visit. Physical Exam  Constitutional: She is oriented to person, place, and time. She appears well-developed and well-nourished. She appears distressed.  HENT:  Head: Normocephalic and atraumatic.  Eyes: Conjunctivae and EOM are normal. Pupils are equal, round, and reactive to light.  Neck: Normal range of motion. Neck supple.  Cardiovascular: Normal rate and regular rhythm.    Respiratory: Effort normal and breath sounds normal.  GI: Soft. Bowel sounds are normal.  Musculoskeletal:  Posterior knee tender to palpation, mid 1/3 of the medial joint line tender to palpation and posterior 1/3 of the medial joint line tender to palpation. Trace effusion. Mild patellofemoral crepitus. Sensation intact to light touch. Skin- no ecchymosis and no erythema. Strength and Tone:Quadriceps- 5/5. Hamstrings- 5/5. AROM:5-100. Passively can extend to 0  with pain. No instability. Equivocal McMurray. Antalgic gait.  Neurological: She is alert and oriented to person, place, and time. She has normal reflexes.  Skin: Skin is warm and dry.  Psychiatric: She has a normal mood and affect.    MRI R knee images and report reviewed today by Dr. Shelle Iron with degeneration and partial avulsion posterior root medial meniscus with mild peripheral extrusion and small horizontal tear of the body. 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 Tibial ganglion 1.3cm at attachment of posterior root medial meniscus. Small effusion.  Assessment/Plan R knee MMT  Pt with progressive pain, swelling, mechanical symptoms, ambulatory dysfunction, MRI with meniscal tear, symptoms also suspicious for chondral flap tear. Refractory to rest, activity modification, HEP, NSAIDs, ice/elevation, intra-articular steroid injection. At this point, recommend proceeding with R knee arthroscopy, partial medial meniscectomy. Discussed the procedure itself as well as risks, complications, and alternatives including but not limited to DVT, PE, infx, bleeding, failure of procedure, need for secondary procedure, anesthesia risk, even death. All questions answered. Discussed post-op course. Pt desires to proceed. Will schedule for R knee arthroscopy.  Andrez Grime M. for Dr. Shelle Iron 10/25/2012, 9:54 PM

## 2012-10-26 NOTE — Anesthesia Postprocedure Evaluation (Signed)
Anesthesia Post Note  Patient: Shelby Brandt  Procedure(s) Performed: Procedure(s) (LRB): RIGHT KNEE ARTHROSCOPY WITH PARTIAL MEDIAL AND LATERAL  MENISECTOMY, DEBRIDEMENT (Right)  Anesthesia type: General  Patient location: PACU  Post pain: Pain level controlled  Post assessment: Post-op Vital signs reviewed  Last Vitals:  Filed Vitals:   10/26/12 1130  BP: 114/83  Pulse: 55  Temp:   Resp: 17    Post vital signs: Reviewed  Level of consciousness: sedated  Complications: No apparent anesthesia complications

## 2012-10-26 NOTE — Interval H&P Note (Signed)
History and Physical Interval Note:  10/26/2012 7:02 AM  Shelby Brandt  has presented today for surgery, with the diagnosis of RIGHT KNEE MEDIAL MENISCUS TEAR  The various methods of treatment have been discussed with the patient and family. After consideration of risks, benefits and other options for treatment, the patient has consented to  Procedure(s): RIGHT KNEE ARTHROSCOPY WITH PARTIAL MEDIAL MENISECTOMY (Right) as a surgical intervention .  The patient's history has been reviewed, patient examined, no change in status, stable for surgery.  I have reviewed the patient's chart and labs.  Questions were answered to the patient's satisfaction.     [name] C

## 2012-10-26 NOTE — Transfer of Care (Signed)
Immediate Anesthesia Transfer of Care Note  Patient: Shelby Brandt  Procedure(s) Performed: Procedure(s): RIGHT KNEE ARTHROSCOPY WITH PARTIAL MEDIAL AND LATERAL  MENISECTOMY, DEBRIDEMENT (Right)  Patient Location: PACU  Anesthesia Type:General  Level of Consciousness: awake, alert  and oriented  Airway & Oxygen Therapy: Patient Spontanous Breathing and Patient connected to face mask oxygen  Post-op Assessment: Report given to PACU RN, Post -op Vital signs reviewed and stable and Patient moving all extremities  Post vital signs: Reviewed and stable  Complications: No apparent anesthesia complications

## 2012-10-27 NOTE — Op Note (Signed)
NAMETANA, TREFRY NO.:  [id number]  MEDICAL RECORD NO.:  [id number]  LOCATION:  [facility]                         FACILITY:  [facility]  PHYSICIAN:  Jene Every, M.D.    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  POSTOPERATIVE DIAGNOSES:  Lateral meniscus tear, grade 3 chondromalacia of patella, medial femoral condyle, medial tibial plateau, lateral tibial plateau __________, right knee arthroscopy, partial medial meniscectomy, chondroplasty of the patella, medial femoral condyle, lateral tibial plateau, partial lateral meniscectomy.  ANESTHESIA:  General.  ASSISTANT:  Lanna Poche, PA-C helped with a valgus positioning to evaluate and address the lateral meniscus.  BRIEF HISTORY:  This is a 57 year old with locking popping giving way. MRI indicating meniscus tear and DJD, back on conservative treatment including rest, activity modification, corticosteroid injection, quad strengthening, indicated for arthroscopic debridement.  Risks and benefits were discussed including bleeding, infection, damage to neurovascular structures, DVT, PE, and anesthetic complications, need for repeat arthroscopy for injections in the future.  TECHNIQUE:  With the patient in supine position, after induction of adequate general anesthesia, 2 g of Kefzol, the right lower extremity was prepped and draped in usual sterile fashion.  A lateral parapatellar portal was fashioned with an #11 blade.  The Ingress cannula atraumatically placed.  Irrigant was utilized to insufflate the joint. Under direct visualization, a medial parapatellar portal was fashioned with a #11 blade after localization with 18-gauge needle sparing the medial meniscus.  __________ medial compartment with grade 3 changes  of femoral condyle, tibial plateau, posterior third of the medial meniscus tear.  The light chondroplasty was performed with femoral condyle and tibial plateau with 3-5 shaver.  We used a straight basket and resected 1/3 of the posterior third at the inner edge.  This was resected to a stable base, further contoured with 3-5 shaver.  The remnant was stable to probe palpation.  No grade 4 changes.  ACL was unremarkable.  Lateral compartmental tear __________ lateral meniscus.  There was grade 3 changes of the lateral tibial plateau on the medial aspect near the tibial spine.  I shaved the lateral meniscus to a stable base.  Light chondroplasty, tibial plateau, no grade 4 changes.  Here the remnant was stable to probe palpation.  Suprapatellar pouch revealed some grade 3 changes of the patella into the sulcus minor.  There was normal patellofemoral tracking.  Gutters were unremarkable.  Light chondroplasty performed of the patella and the sulcus.  All the compartments revisited.  No further pathology amenable to arthroscopic intervention.  I, therefore, removed all instrumentation and portals.  Portals were closed with 4-0 nylon simple sutures, 0.25% Marcaine with epinephrine.  __________ wound dressed sterilely.  Awoken without difficulty and transported to the recovery room in satisfactory condition.  The patient tolerated the procedure well with no complications.  No blood loss.     Jene Every, M.D.     Cordelia Pen  D:  10/26/2012  T:  10/27/2012  Job:  [id number]

## 2012-10-29 ENCOUNTER — Encounter (HOSPITAL_COMMUNITY): Payer: Self-pay | Admitting: Specialist

## 2013-02-11 ENCOUNTER — Emergency Department (INDEPENDENT_AMBULATORY_CARE_PROVIDER_SITE_OTHER): Payer: PRIVATE HEALTH INSURANCE

## 2013-02-11 ENCOUNTER — Emergency Department (INDEPENDENT_AMBULATORY_CARE_PROVIDER_SITE_OTHER)
Admission: EM | Admit: 2013-02-11 | Discharge: 2013-02-11 | Disposition: A | Discharge status: Home / Self Care | Payer: PRIVATE HEALTH INSURANCE | Source: Home / Self Care | Attending: Family Medicine | Admitting: Family Medicine

## 2013-02-11 DIAGNOSIS — R350 Frequency of micturition: Secondary | ICD-10-CM

## 2013-02-11 LAB — POCT URINALYSIS DIP (MANUAL ENTRY)
Glucose, UA: NEGATIVE
Spec Grav, UA: >=1.03 (ref 1.005–1.03)

## 2013-02-11 MED ORDER — CEPHALEXIN 500 MG PO CAPS: 500.0000 mg | ORAL_CAPSULE | Freq: Three times a day (TID) | ORAL | Status: DC

## 2013-02-11 NOTE — ED Provider Notes (Signed)
CSN: [id number]     Arrival date & time 02/11/13  1733 History     First MD Initiated Contact with Patient 02/11/13 1743     Chief Complaint  Patient presents with  . Urinary Frequency    x 2 days    HPI  DYSURIA Onset:  2 days  Description: increased urinary frequency, low back pain Modifying factors: prior hx/o kidney stones, no hematuria. Sxs milder in comparison to prior kidney stones.  Symptoms Urgency:  yes Frequency: yes  Hesitancy:  yes Hematuria:  no Flank Pain:  Mild R sided  Fever: no Nausea/Vomiting:  no Missed LMP: no STD exposure: no Discharge: no Irritants: no Rash: no  Red Flags   More than 3 UTI's last 12 months:  no PMH of  Diabetes or Immunosuppression:  no Renal Disease/Calculi: no Urinary Tract Abnormality:  no Instrumentation or Trauma: no    Past Medical History  Diagnosis Date  . Hypothyroidism    Past Surgical History  Procedure Laterality Date  . Thyroidectomy, partial  2010    right  . Tonsillectomy      as adult  . Mandible surgery  1994    and upper jaw  . Knee arthroscopy Right 10/26/2012    Procedure: RIGHT KNEE ARTHROSCOPY WITH PARTIAL MEDIAL AND LATERAL  MENISECTOMY, DEBRIDEMENT;  Surgeon: Javier Docker, MD;  Location: [facility];  Service: Orthopedics;  Laterality: Right;   History reviewed. No pertinent family history. History  Substance Use Topics  . Smoking status: Never Smoker   . Smokeless tobacco: Never Used  . Alcohol Use: Yes     Comment: occassionally   OB History   Grav Para Term Preterm Abortions TAB SAB Ect Mult Living                 Review of Systems  All other systems reviewed and are negative.    Allergies  Ivp dye  Home Medications   Current Outpatient Rx  Name  Route  Sig  Dispense  Refill  . levothyroxine (SYNTHROID, LEVOTHROID) 75 MCG tablet   Oral   Take 75 mcg by mouth daily before breakfast.         . cephALEXin (KEFLEX) 500 MG capsule   Oral   Take 1 capsule (500 mg total)  by mouth 3 (three) times daily.   21 capsule   0   . cholecalciferol (VITAMIN D) 1000 UNITS tablet   Oral   Take 1,000 Units by mouth daily.         . fluticasone (FLONASE) 50 MCG/ACT nasal spray   Nasal   Place 2 sprays into the nose 2 (two) times daily.         Marland Kitchen HYDROcodone-acetaminophen (NORCO) 5-325 MG per tablet   Oral   Take 1-2 tablets by mouth every 6 (six) hours as needed for pain.   30 tablet   0   . Lutein 20 MG TABS   Oral   Take 20 mg by mouth daily.         . magnesium gluconate (MAGONATE) 500 MG tablet   Oral   Take 500 mg by mouth 2 (two) times daily.          BP 125/79  Pulse 72  Temp(Src) 98.2 F (36.8 C) (Oral)  Ht 5\' 8"  (1.727 m)  Wt 175 lb (79.379 kg)  BMI 26.61 kg/m2  SpO2 98% Physical Exam  Constitutional: She appears well-developed and well-nourished.  HENT:  Head: Normocephalic and  atraumatic.  Eyes: Pupils are equal, round, and reactive to light.  Neck: Normal range of motion. Neck supple.  Cardiovascular: Normal rate and regular rhythm.   Pulmonary/Chest: Effort normal.  Abdominal: Soft. Bowel sounds are normal.  Mild R sided flank pain  + suprapubic tenderness   Musculoskeletal: Normal range of motion.  Neurological: She is alert.  Skin: Skin is warm.    ED Course   Procedures (including critical care time)  Labs Reviewed  POCT URINALYSIS DIP (MANUAL ENTRY) - Abnormal; Notable for the following:   URINE CULTURE   Dg Abd 1 View  02/11/2013   *RADIOLOGY REPORT*  Clinical Data: Urinary frequency and concern for renal calculi  ABDOMEN - 1 VIEW  Comparison: CT 07/27/2012  Findings: No nephrolithiasis by plain film.  No ureteral calculi or bladder calculi.  IMPRESSION: No nephrolithiasis or ureterolithiasis.   Original Report Authenticated By: Genevive Bi, M.D.   1. Frequent urination     MDM  KUB negative for kidney stones Trace blood on UA which may be from faint kidney stone vs. UTI.  Will treat with keflex.  Urine culture.  Discussed GU and kidney stone red flags.     The patient and/or caregiver has been counseled thoroughly with regard to treatment plan and/or medications prescribed including dosage, schedule, interactions, rationale for use, and possible side effects and they verbalize understanding. Diagnoses and expected course of recovery discussed and will return if not improved as expected or if the condition worsens. Patient and/or caregiver verbalized understanding.      Follow up as needed.   Doree Albee, MD 02/11/13 [phone]

## 2013-02-11 NOTE — ED Notes (Signed)
Shelby Brandt complains of frequent urination for 2 days. She also has low back pain. Denies fever, chills or sweats.

## 2013-02-14 ENCOUNTER — Telehealth: Payer: Self-pay | Admitting: *Deleted

## 2013-05-14 ENCOUNTER — Other Ambulatory Visit (HOSPITAL_COMMUNITY): Payer: Self-pay | Admitting: Obstetrics & Gynecology

## 2013-05-14 DIAGNOSIS — Z1231 Encounter for screening mammogram for malignant neoplasm of breast: Secondary | ICD-10-CM

## 2013-05-29 ENCOUNTER — Ambulatory Visit (HOSPITAL_COMMUNITY): Payer: PRIVATE HEALTH INSURANCE

## 2013-05-30 ENCOUNTER — Ambulatory Visit (HOSPITAL_COMMUNITY)
Admission: RE | Admit: 2013-05-30 | Discharge: 2013-05-30 | Disposition: A | Discharge status: Home / Self Care | Payer: PRIVATE HEALTH INSURANCE | Source: Ambulatory Visit | Attending: Obstetrics & Gynecology | Admitting: Obstetrics & Gynecology

## 2013-05-30 DIAGNOSIS — Z1231 Encounter for screening mammogram for malignant neoplasm of breast: Secondary | ICD-10-CM | POA: Insufficient documentation

## 2013-07-24 ENCOUNTER — Institutional Professional Consult (permissible substitution): Payer: Self-pay | Admitting: Neurology

## 2014-05-01 ENCOUNTER — Other Ambulatory Visit (HOSPITAL_COMMUNITY): Payer: Self-pay | Admitting: Obstetrics & Gynecology

## 2014-05-01 DIAGNOSIS — Z1231 Encounter for screening mammogram for malignant neoplasm of breast: Secondary | ICD-10-CM

## 2014-06-02 ENCOUNTER — Ambulatory Visit (HOSPITAL_COMMUNITY)
Admission: RE | Admit: 2014-06-02 | Discharge: 2014-06-02 | Disposition: A | Discharge status: Home / Self Care | Payer: 59 | Source: Ambulatory Visit | Attending: Obstetrics & Gynecology | Admitting: Obstetrics & Gynecology

## 2014-06-02 DIAGNOSIS — Z1231 Encounter for screening mammogram for malignant neoplasm of breast: Secondary | ICD-10-CM | POA: Insufficient documentation

## 2014-10-21 ENCOUNTER — Ambulatory Visit (INDEPENDENT_AMBULATORY_CARE_PROVIDER_SITE_OTHER): Payer: 59 | Admitting: Neurology

## 2014-10-21 ENCOUNTER — Encounter: Payer: Self-pay | Admitting: Neurology

## 2014-10-21 VITALS — BP 130/84 | HR 68 | Resp 12 | Ht 68.0 in | Wt 182.6 lb

## 2014-10-21 DIAGNOSIS — R482 Apraxia: Secondary | ICD-10-CM

## 2014-10-21 DIAGNOSIS — H579 Unspecified disorder of eye and adnexa: Secondary | ICD-10-CM | POA: Diagnosis not present

## 2014-10-21 DIAGNOSIS — G250 Essential tremor: Secondary | ICD-10-CM

## 2014-10-21 MED ORDER — TRIHEXYPHENIDYL HCL 2 MG PO TABS: 2.0000 mg | ORAL_TABLET | Freq: Every evening | ORAL | Status: DC | PRN

## 2014-10-21 NOTE — Progress Notes (Signed)
[facility]  PATIENT: Shelby Brandt DOB: January 27, 1956  REFERRING DOCTOR OR PCP:  Marjory LiesBrent Burnett (PCP);  SOURCE: patient and records form PCP  _________________________________   HISTORICAL  CHIEF COMPLAINT:  Chief Complaint  Patient presents with  . Eye Pain    Sts. onset in November 2015 of feeling pressure behind her left eye.  Given her mother's hx. of macular degeneration and glaucoma, she was concerned and so f/u with her opthalmologist and sts. was told everything looked ok.  Sts. sensation of pressure behind left eye remains constant, has never resolved.  She also has notice more difficulty opening left eye at night and in the early morning. She also c/o occasional twitching left eye.  Denies visual disturbance, denies h/a's./fim    HISTORY OF PRESENT ILLNESS:  [facility] regarding her left eyelid ptosis. Since November, she has had episodes that occur almost nightly. If she wakes up in the night or first thing in the morning she will have difficulty opening up the left eye. Her right eye opens normally. If she thinks about it for a little longer or pricer eyes open she is able to open them up. Once her left eye opens up it is usually good for the rest of the day however, if she does this in the middle of the night, when she falls back asleep, it will reoccur when she wakes up again.  During the day, she will usually have a mild pressure sensation in the left eye. Because of a family history of glaucoma and macular degeneration, or her eye doctor couple months ago. A thorough eye exam did not show any evidence of any primary ocular disorder.   She does not have ptosis during the day and does not have any noticeable eye spasms. She denies any difficulty with her vision or double vision. She denies any difficulty with numbness or weakness. She has not noted any  significant difficulty with fatiguing. She notes no proximal weakness.  She has not had any imaging studies.  She does have a tremor in the left more than right hands. The tremor will intensify if she tries to hold still or if she is holding an item or utensil. If she is nervous or mad the tremor may get worse. Her father and several other relatives also have essential tremor. Her father's tremor worsened with age. In her father's mid 880s, he had trouble holding utensils and using them well.  REVIEW OF SYSTEMS: Constitutional: No fevers, chills, sweats, or change in appetite Eyes: as above Ear, nose and throat: No hearing loss, ear pain, nasal congestion, sore throat Cardiovascular: No chest pain, palpitations Respiratory: No shortness of breath at rest or with exertion.   No wheezes GastrointestinaI: No nausea, vomiting, diarrhea, abdominal pain, fecal incontinence Genitourinary: No dysuria, urinary retention or frequency.  No nocturia. Musculoskeletal: No neck pain, back pain Integumentary: No rash, pruritus, skin lesions Neurological: as above Psychiatric: No depression at this time.  No anxiety Endocrine: No palpitations, diaphoresis, change in appetite, change in weigh or increased thirst Hematologic/Lymphatic: No anemia, purpura, petechiae. Allergic/Immunologic: No itchy/runny eyes, nasal congestion, recent allergic reactions, rashes  ALLERGIES: Allergies  Allergen Reactions  . Ivp Dye [Iodinated Diagnostic Agents] Hives    respiratory problems     HOME MEDICATIONS:  Current outpatient prescriptions:  .  cholecalciferol (VITAMIN D) 1000 UNITS tablet, Take 1,000 Units by mouth daily., Disp: ,  Rfl:  .  fluticasone (FLONASE) 50 MCG/ACT nasal spray, Place 2 sprays into the nose 2 (two) times daily., Disp: , Rfl:  .  levothyroxine (SYNTHROID, LEVOTHROID) 75 MCG tablet, Take 75 mcg by mouth daily before breakfast., Disp: , Rfl:  .  Lutein 20 MG TABS, Take 20 mg by mouth  daily., Disp: , Rfl:  .  magnesium gluconate (MAGONATE) 500 MG tablet, Take 500 mg by mouth 2 (two) times daily., Disp: , Rfl:   PAST MEDICAL HISTORY: Past Medical History  Diagnosis Date  . Hypothyroidism   . Vision abnormalities     PAST SURGICAL HISTORY: Past Surgical History  Procedure Laterality Date  . Thyroidectomy, partial  2010    right  . Tonsillectomy      as adult  . Mandible surgery  1994    and upper jaw  . Knee arthroscopy Right 10/26/2012    Procedure: RIGHT KNEE ARTHROSCOPY WITH PARTIAL MEDIAL AND LATERAL  MENISECTOMY, DEBRIDEMENT;  Surgeon: Javier Docker, MD;  Location: [facility];  Service: Orthopedics;  Laterality: Right;    FAMILY HISTORY: Family History  Problem Relation Age of Onset  . Macular degeneration Mother   . Glaucoma Mother   . Atrial fibrillation Mother   . Hypertension Mother   . Hypertension Father   . Atrial fibrillation Father   . COPD Father     SOCIAL HISTORY:  History   Social History  . Marital Status: Married    Spouse Name: N/A  . Number of Children: N/A  . Years of Education: N/A   Occupational History  . Not on file.   Social History Main Topics  . Smoking status: Never Smoker   . Smokeless tobacco: Never Used  . Alcohol Use: Yes     Comment: occassionally  . Drug Use: No  . Sexual Activity: Not on file   Other Topics Concern  . Not on file   Social History Narrative     PHYSICAL EXAM  Filed Vitals:   10/21/14 1358  BP: 130/84  Pulse: 68  Resp: 12  Height:  (1.727 m)  Weight: 182 lb 9.6 oz (82.827 kg)    Body mass index is 27.77 kg/(m^2).   General: The patient is well-developed and well-nourished and in no acute distress  Eyes:  Funduscopic exam shows normal optic discs and retinal vessels.  Neck: The neck is supple, no carotid bruits are noted.    Cardiovascular: The heart has a regular rate and rhythm with a normal S1 and S2. There were no murmurs, gallops or rubs.   Neurologic  Exam  Mental status: The patient is alert and oriented x 3 at the time of the examination. The patient has apparent normal recent and remote memory, with an apparently normal attention span and concentration ability.   Speech is normal.  Cranial nerves: Extraocular movements are full. Pupils are equal, round, and reactive to light and accomodation. Facial symmetry is present. There is good facial sensation to soft touch bilaterally.Facial strength is normal.  Trapezius and sternocleidomastoid strength is normal. No dysarthria is noted.  The tongue is midline, and the patient has symmetric elevation of the soft palate.   Motor:  Muscle bulk is normal.   Tone is normal. Strength is  5 / 5 in all 4 extremities.   Sensory: Sensory testing is intact to soft touch in all 4 extremities.  Coordination: Cerebellar testing reveals good finger-nose-finger  bilaterally.  Gait and station: Station is normal.  Gait is normal. Tandem gait is normal.   Reflexes: Deep tendon reflexes are symmetric and normal bilaterally.       DIAGNOSTIC DATA (LABS, IMAGING, TESTING) - I reviewed patient records, labs, notes, testing and imaging myself where available.     ASSESSMENT AND PLAN  Apraxia of eyelid opening  Essential tremor  Eye pressure   In summary, Shelby Brandt is a 59 year old woman who has a 5 month history of difficulty opening up her left eye after waking up. Once her eye opens up completely, she has no difficulty using it. This is most consistent with apraxia of eyelid opening. This can occur in otherwise healthy individuals though most commonly occurs in association with parkinsonian disorders. She has no evidence of Parkinson's disease or other parkinsonian disorder on exam. Her tremor is very consistent with benign essential tremor and not with Parkinson's. As this is a benign condition, treatment is optional. I wrote a prescription for trihexyphenidyl 2 mg nightly that she may take area if  this is not beneficial she should discontinue. Sometimes rubbing the eye or manually opening up the eye will break the apraxia.     She also has a mild benign essential tremor. This does not appear to be symptomatic enough to treat at this point but I went over some of the therapeutic options if this worsens.  If her eyelid closing or eye pressure sensation worsens, she should contact us and I will consider obtaining an MRI of the brain   Richard A. Epimenio Foot, MD, PhD 10/21/2014, 2:16 PM Certified in Neurology, Clinical Neurophysiology, Sleep Medicine, Pain Medicine and Neuroimaging  [facility] [address] [place], [place] [address] [phone]

## 2015-02-20 ENCOUNTER — Ambulatory Visit: Payer: 59 | Admitting: Neurology

## 2015-08-13 ENCOUNTER — Other Ambulatory Visit: Payer: Self-pay

## 2015-08-13 DIAGNOSIS — Z1231 Encounter for screening mammogram for malignant neoplasm of breast: Secondary | ICD-10-CM

## 2015-09-01 ENCOUNTER — Other Ambulatory Visit: Payer: Self-pay

## 2015-09-01 ENCOUNTER — Ambulatory Visit
Admission: RE | Admit: 2015-09-01 | Discharge: 2015-09-01 | Disposition: A | Discharge status: Home / Self Care | Payer: 59 | Source: Ambulatory Visit

## 2015-09-01 DIAGNOSIS — Z1231 Encounter for screening mammogram for malignant neoplasm of breast: Secondary | ICD-10-CM

## 2015-09-03 ENCOUNTER — Other Ambulatory Visit: Payer: Self-pay | Admitting: Obstetrics & Gynecology

## 2015-09-03 DIAGNOSIS — R928 Other abnormal and inconclusive findings on diagnostic imaging of breast: Secondary | ICD-10-CM

## 2015-09-09 ENCOUNTER — Ambulatory Visit
Admission: RE | Admit: 2015-09-09 | Discharge: 2015-09-09 | Disposition: A | Discharge status: Home / Self Care | Payer: 59 | Source: Ambulatory Visit | Attending: Obstetrics & Gynecology | Admitting: Obstetrics & Gynecology

## 2015-09-09 DIAGNOSIS — R928 Other abnormal and inconclusive findings on diagnostic imaging of breast: Secondary | ICD-10-CM

## 2016-01-20 ENCOUNTER — Other Ambulatory Visit: Payer: Self-pay | Admitting: Family Medicine

## 2016-01-20 DIAGNOSIS — R1031 Right lower quadrant pain: Secondary | ICD-10-CM

## 2016-01-26 ENCOUNTER — Ambulatory Visit
Admission: RE | Admit: 2016-01-26 | Discharge: 2016-01-26 | Disposition: A | Discharge status: Home / Self Care | Payer: 59 | Source: Ambulatory Visit | Attending: Family Medicine | Admitting: Family Medicine

## 2016-01-26 DIAGNOSIS — R1031 Right lower quadrant pain: Secondary | ICD-10-CM

## 2016-08-15 ENCOUNTER — Other Ambulatory Visit: Payer: Self-pay | Admitting: Obstetrics & Gynecology

## 2016-08-15 DIAGNOSIS — Z1231 Encounter for screening mammogram for malignant neoplasm of breast: Secondary | ICD-10-CM

## 2016-09-20 ENCOUNTER — Ambulatory Visit
Admission: RE | Admit: 2016-09-20 | Discharge: 2016-09-20 | Disposition: A | Discharge status: Home / Self Care | Payer: Commercial Managed Care - PPO | Source: Ambulatory Visit | Attending: Obstetrics & Gynecology | Admitting: Obstetrics & Gynecology

## 2016-09-20 DIAGNOSIS — Z1231 Encounter for screening mammogram for malignant neoplasm of breast: Secondary | ICD-10-CM

## 2017-09-15 ENCOUNTER — Other Ambulatory Visit: Payer: Self-pay | Admitting: Obstetrics & Gynecology

## 2017-09-15 DIAGNOSIS — Z1231 Encounter for screening mammogram for malignant neoplasm of breast: Secondary | ICD-10-CM

## 2017-10-09 ENCOUNTER — Ambulatory Visit
Admission: RE | Admit: 2017-10-09 | Discharge: 2017-10-09 | Disposition: A | Discharge status: Home / Self Care | Payer: Commercial Managed Care - PPO | Source: Ambulatory Visit | Attending: Obstetrics & Gynecology | Admitting: Obstetrics & Gynecology

## 2017-10-09 DIAGNOSIS — Z1231 Encounter for screening mammogram for malignant neoplasm of breast: Secondary | ICD-10-CM

## 2018-12-18 ENCOUNTER — Other Ambulatory Visit: Payer: Self-pay | Admitting: Obstetrics & Gynecology

## 2018-12-18 DIAGNOSIS — Z1231 Encounter for screening mammogram for malignant neoplasm of breast: Secondary | ICD-10-CM

## 2019-01-31 ENCOUNTER — Ambulatory Visit
Admission: RE | Admit: 2019-01-31 | Discharge: 2019-01-31 | Disposition: A | Discharge status: Home / Self Care | Payer: Commercial Managed Care - PPO | Source: Ambulatory Visit | Attending: Obstetrics & Gynecology | Admitting: Obstetrics & Gynecology

## 2019-01-31 ENCOUNTER — Other Ambulatory Visit: Payer: Self-pay

## 2019-01-31 DIAGNOSIS — Z1231 Encounter for screening mammogram for malignant neoplasm of breast: Secondary | ICD-10-CM

## 2019-04-01 ENCOUNTER — Other Ambulatory Visit: Payer: Self-pay | Admitting: Orthopedic Surgery

## 2019-04-01 DIAGNOSIS — G8929 Other chronic pain: Secondary | ICD-10-CM

## 2019-04-01 DIAGNOSIS — M25571 Pain in right ankle and joints of right foot: Secondary | ICD-10-CM

## 2019-05-17 ENCOUNTER — Ambulatory Visit
Admission: RE | Admit: 2019-05-17 | Discharge: 2019-05-17 | Disposition: A | Discharge status: Home / Self Care | Payer: Commercial Managed Care - PPO | Source: Ambulatory Visit | Attending: Orthopedic Surgery | Admitting: Orthopedic Surgery

## 2019-05-17 ENCOUNTER — Other Ambulatory Visit: Payer: Self-pay

## 2019-05-17 DIAGNOSIS — G8929 Other chronic pain: Secondary | ICD-10-CM

## 2020-04-15 ENCOUNTER — Other Ambulatory Visit: Payer: Self-pay | Admitting: Obstetrics & Gynecology

## 2020-04-15 DIAGNOSIS — Z1231 Encounter for screening mammogram for malignant neoplasm of breast: Secondary | ICD-10-CM

## 2020-05-26 ENCOUNTER — Other Ambulatory Visit: Payer: Self-pay

## 2020-05-26 ENCOUNTER — Ambulatory Visit
Admission: RE | Admit: 2020-05-26 | Discharge: 2020-05-26 | Disposition: A | Discharge status: Home / Self Care | Payer: Managed Care, Other (non HMO) | Source: Ambulatory Visit | Attending: Obstetrics & Gynecology | Admitting: Obstetrics & Gynecology

## 2020-05-26 DIAGNOSIS — Z1231 Encounter for screening mammogram for malignant neoplasm of breast: Secondary | ICD-10-CM

## 2021-04-27 ENCOUNTER — Other Ambulatory Visit: Payer: Self-pay | Admitting: Obstetrics and Gynecology

## 2021-04-27 DIAGNOSIS — Z1231 Encounter for screening mammogram for malignant neoplasm of breast: Secondary | ICD-10-CM

## 2021-06-02 ENCOUNTER — Ambulatory Visit
Admission: RE | Admit: 2021-06-02 | Discharge: 2021-06-02 | Disposition: A | Discharge status: Home / Self Care | Payer: Managed Care, Other (non HMO) | Source: Ambulatory Visit | Attending: Obstetrics and Gynecology | Admitting: Obstetrics and Gynecology

## 2021-06-02 DIAGNOSIS — Z1231 Encounter for screening mammogram for malignant neoplasm of breast: Secondary | ICD-10-CM

## 2021-12-08 IMAGING — MG MM DIGITAL SCREENING BILAT W/ TOMO AND CAD
6 of 10 series · 6 of 30 positions shown · non-contrast
Comparison: Previous exam(s).

CLINICAL DATA: Screening.

EXAM:
DIGITAL SCREENING BILATERAL MAMMOGRAM WITH TOMOSYNTHESIS AND CAD
TECHNIQUE: Bilateral screening digital craniocaudal and mediolateral oblique
mammograms were obtained. Bilateral screening digital breast
tomosynthesis was performed. The images were evaluated with
computer-aided detection.

[R CC synth-2D]
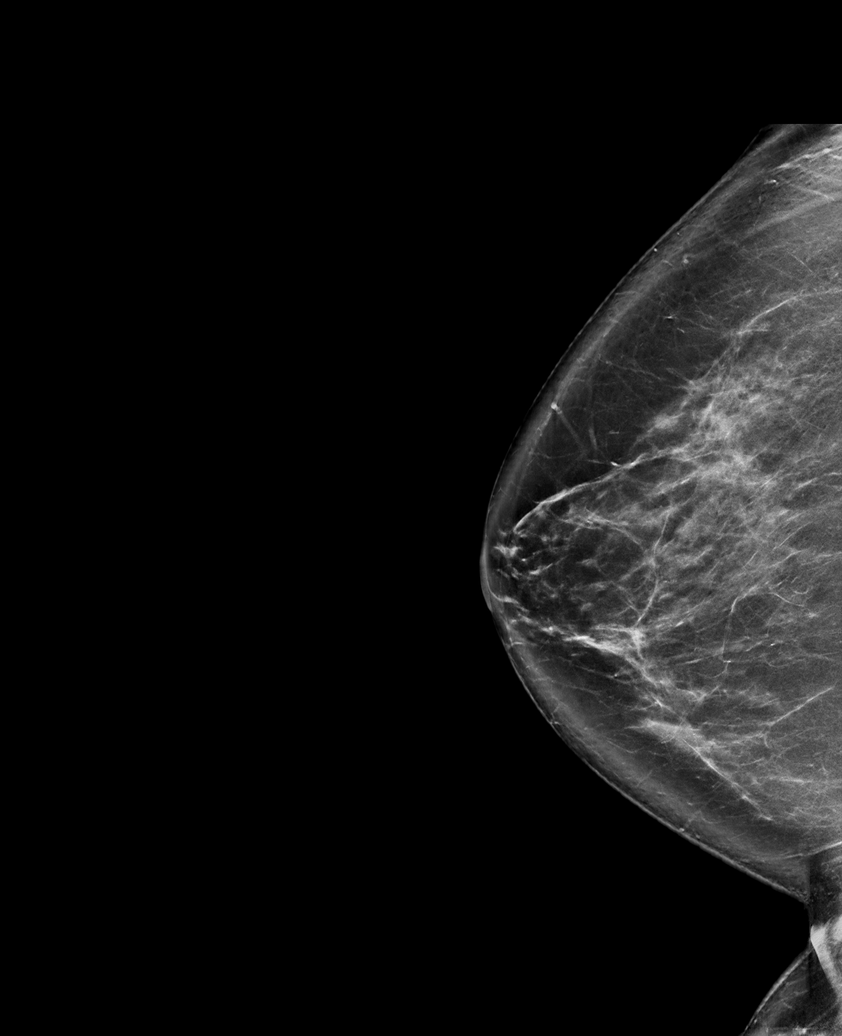

[R MLO synth-2D (1 of 2)]
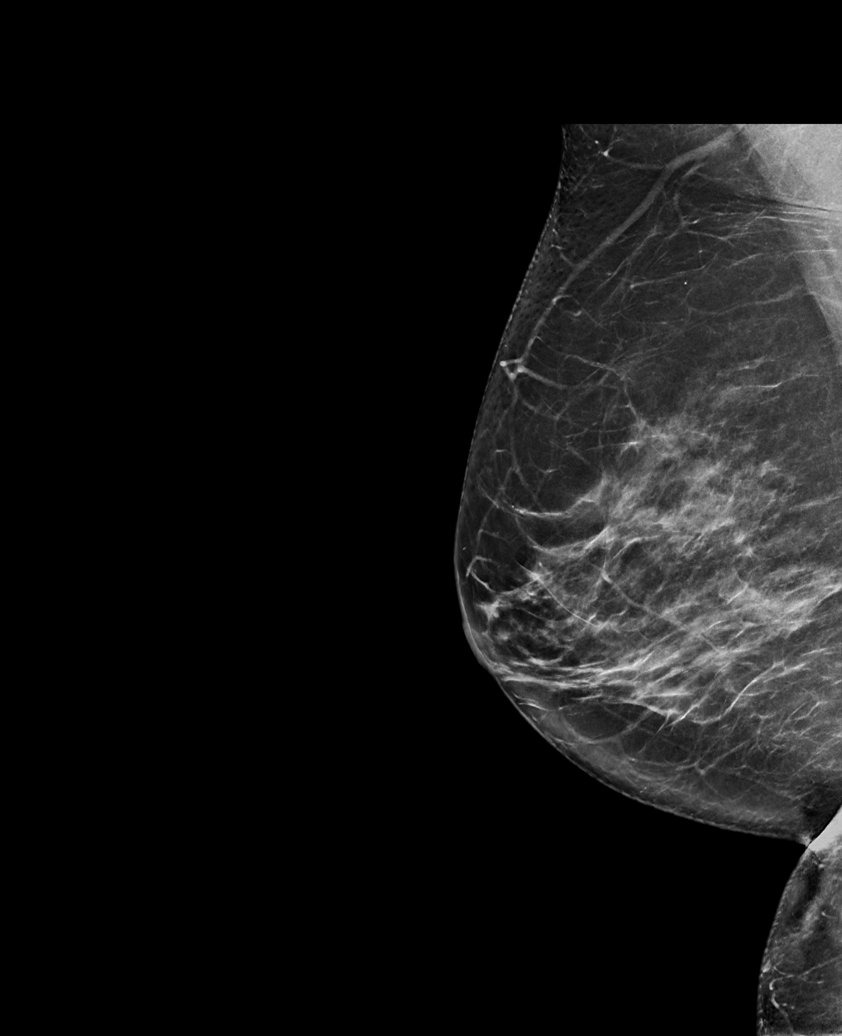

[R MLO synth-2D (2 of 2)]
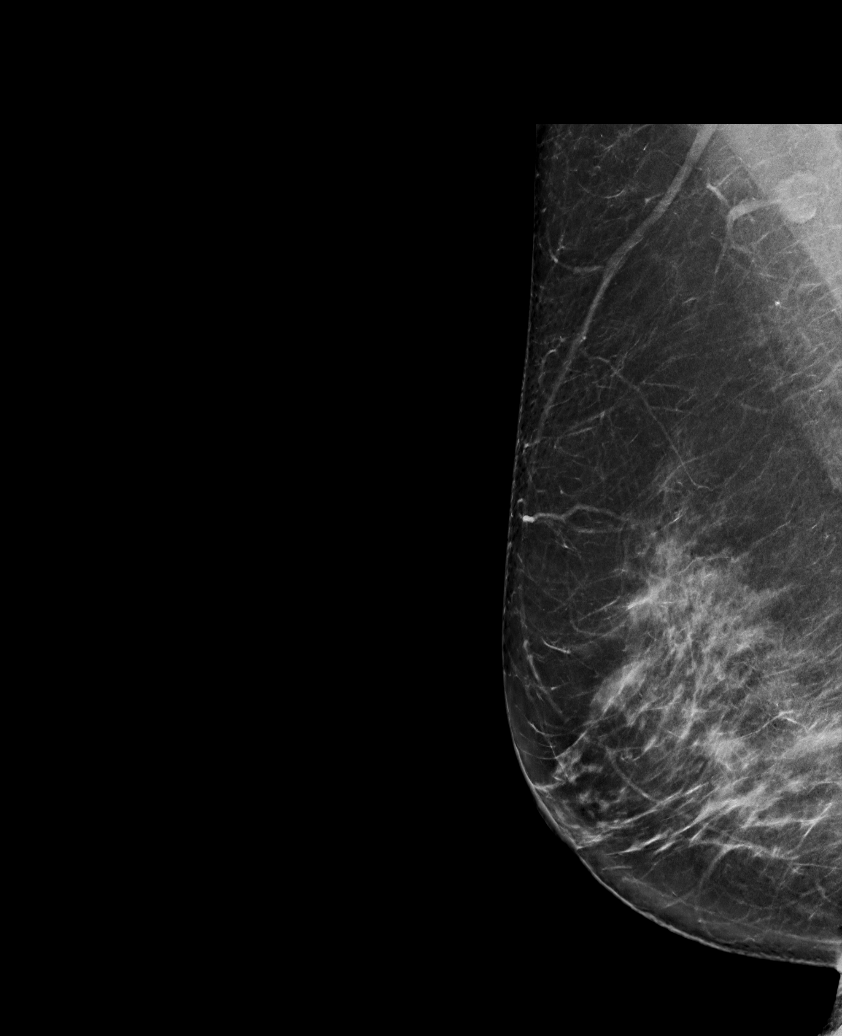

[L MLO synth-2D]
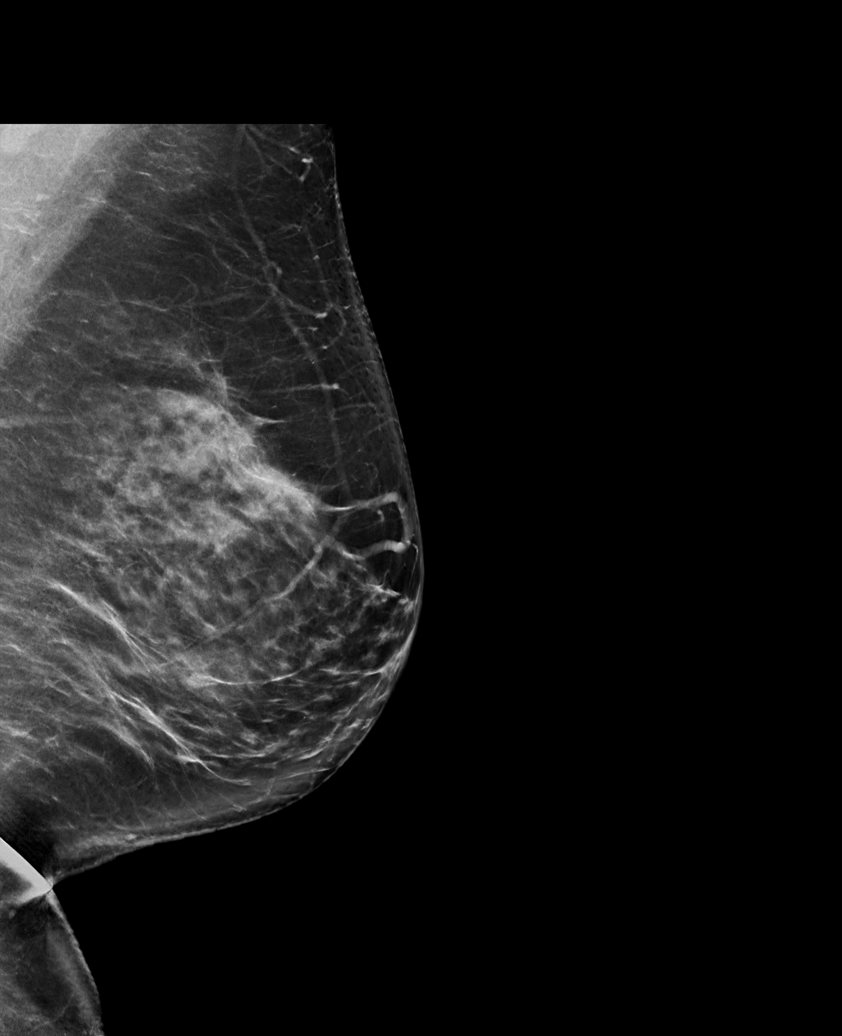

[L CC synth-2D]
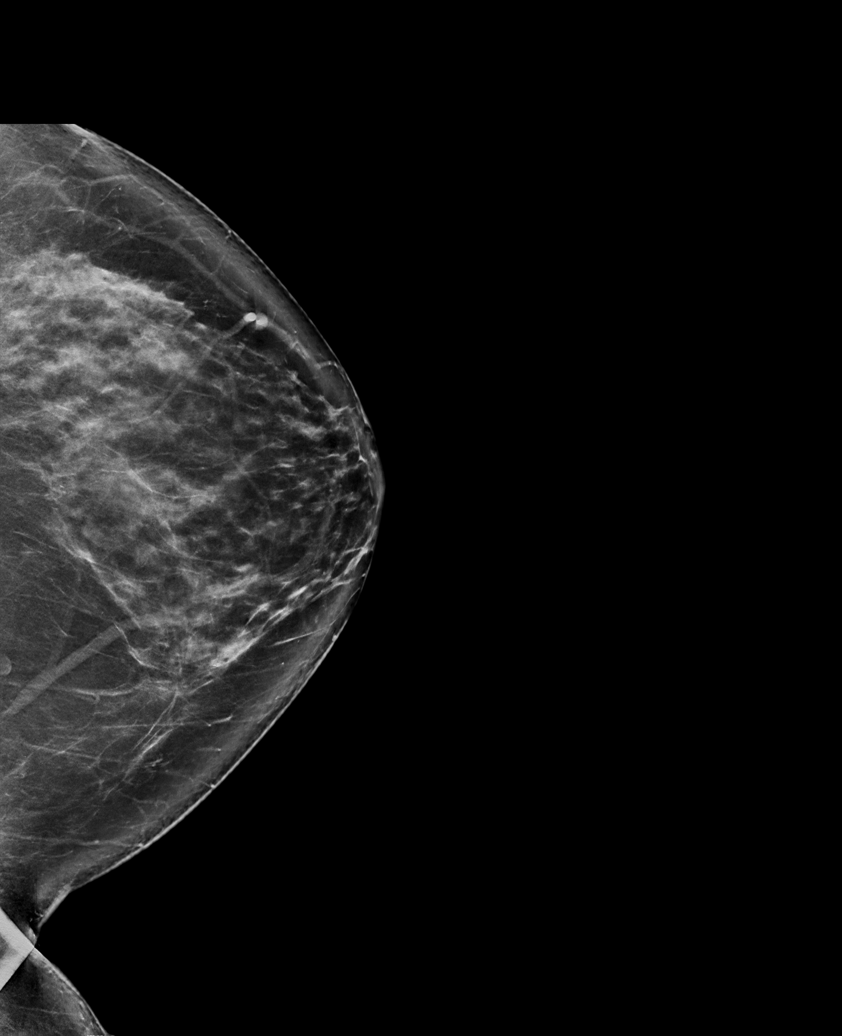

[R CC tomo · tomo slice 47/94.0]
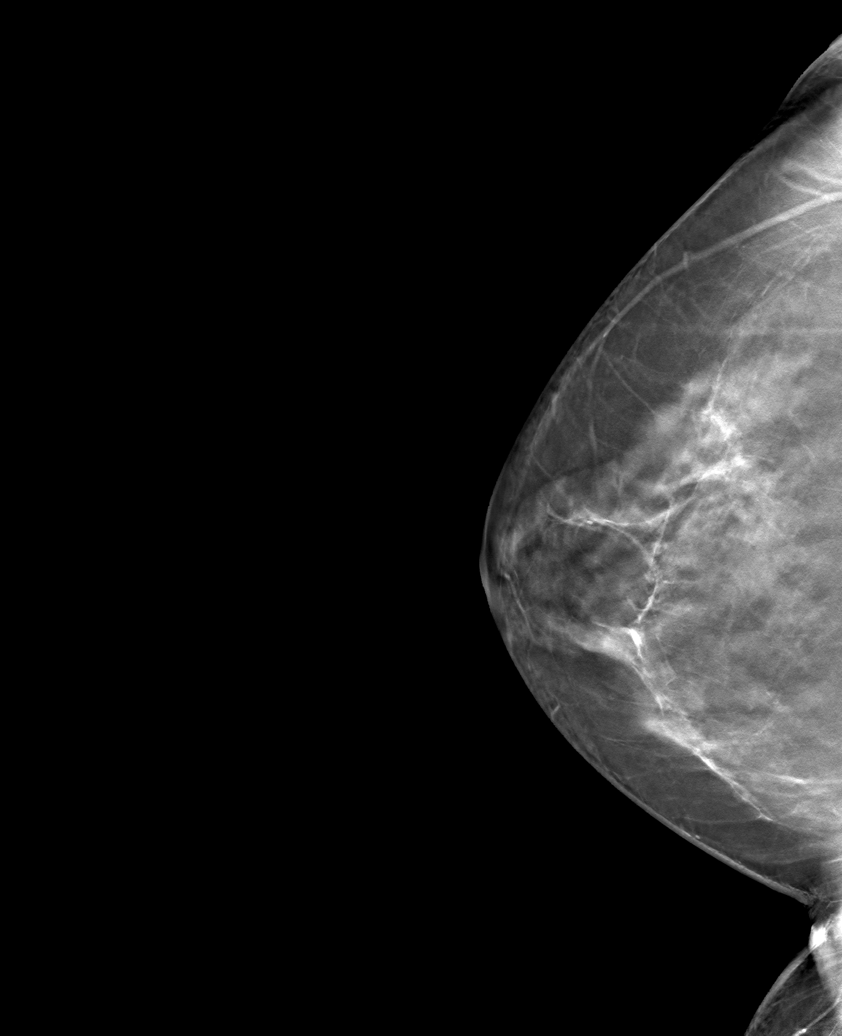

[6 of 30 positions shown; findings below may reference images not displayed]

ACR Breast Density Category c: The breast tissue is heterogeneously
dense, which may obscure small masses.
FINDINGS: There are no findings suspicious for malignancy.
IMPRESSION: No mammographic evidence of malignancy. A result letter of this
screening mammogram will be mailed directly to the patient.

RECOMMENDATION:
Screening mammogram in one year. (Code:[id number]-W-BC3)

BI-RADS CATEGORY  1: Negative.

## 2022-01-06 ENCOUNTER — Other Ambulatory Visit: Payer: Self-pay | Admitting: Family Medicine

## 2022-01-06 DIAGNOSIS — E042 Nontoxic multinodular goiter: Secondary | ICD-10-CM

## 2022-01-06 DIAGNOSIS — E039 Hypothyroidism, unspecified: Secondary | ICD-10-CM

## 2022-01-10 ENCOUNTER — Ambulatory Visit
Admission: RE | Admit: 2022-01-10 | Discharge: 2022-01-10 | Disposition: A | Discharge status: Home / Self Care | Payer: Managed Care, Other (non HMO) | Source: Ambulatory Visit | Attending: Family Medicine

## 2022-01-10 DIAGNOSIS — E042 Nontoxic multinodular goiter: Secondary | ICD-10-CM

## 2022-01-10 DIAGNOSIS — E039 Hypothyroidism, unspecified: Secondary | ICD-10-CM

## 2022-06-08 ENCOUNTER — Other Ambulatory Visit: Payer: Self-pay | Admitting: Obstetrics and Gynecology

## 2022-06-08 DIAGNOSIS — Z1231 Encounter for screening mammogram for malignant neoplasm of breast: Secondary | ICD-10-CM

## 2022-07-27 ENCOUNTER — Ambulatory Visit: Payer: Self-pay | Admitting: Orthopedic Surgery

## 2022-07-28 ENCOUNTER — Ambulatory Visit: Payer: Self-pay | Admitting: Orthopedic Surgery

## 2022-07-28 NOTE — H&P (View-Only) (Signed)
Shelby Brandt is an 67 y.o. female.   Chief Complaint: left shoulder pain HPI: Visit For: Follow up Hand Dominance: right Location: left; shoulder Duration: >9 months Quality: aching; worsening Severity: pain level 5/10 Alleviating Factors: rest; limited weight bearing; OTC medication (tylenol prn); using voltaren gel Aggravating Factors: lifting; ROM; weightbearing Associated Symptoms: numbness; tingling Previous Surgery: none Prior Imaging: x ray; MRI Previous Injections: helped temporarily; 03/08/22, 10/25/21, 08/30/21 - only about 2 weeks of relief Previous PT: did not help Patient is minimal relief from her injections although definite for short period of time she been doing exercises at home without avail.  Past Medical History:  Diagnosis Date   Hypothyroidism    Vision abnormalities     Past Surgical History:  Procedure Laterality Date   KNEE ARTHROSCOPY Right 10/26/2012   Procedure: RIGHT KNEE ARTHROSCOPY WITH PARTIAL MEDIAL AND LATERAL  MENISECTOMY, DEBRIDEMENT;  Surgeon: Johnn Hai, MD;  Location: [facility];  Service: Orthopedics;  Laterality: Right;   MANDIBLE SURGERY  1994   and upper jaw   THYROIDECTOMY, PARTIAL  2010   right   TONSILLECTOMY     as adult    Family History  Problem Relation Age of Onset   Macular degeneration Mother    Glaucoma Mother    Atrial fibrillation Mother    Hypertension Mother    Hypertension Father    Atrial fibrillation Father    COPD Father    Breast cancer Neg Hx    Social History:  reports that she has never smoked. She has never used smokeless tobacco. She reports current alcohol use. She reports that she does not use drugs.  Allergies:  Allergies  Allergen Reactions   Ivp Dye [Iodinated Contrast Media] Hives    respiratory problems    Current meds: albuterol sulfate HFA 90 mcg/actuation aerosol inhaler azelastine 137 mcg-fluticasone 50 mcg/spray nasal spray EPINEPHrine 0.3 mg/0.3 mL injection,  auto-injector HYDROcodone 5 mg-acetaminophen 325 mg tablet levothyroxine 75 mcg tablet lisinopriL 5 mg tablet nitrofurantoin monohydrate/macrocrystals 100 mg capsule omeprazole 40 mg capsule,delayed release  Review of Systems  Constitutional: Negative.   HENT: Negative.    Eyes: Negative.   Respiratory: Negative.    Cardiovascular: Negative.   Gastrointestinal: Negative.   Endocrine: Negative.   Genitourinary: Negative.   Musculoskeletal:  Positive for arthralgias.  Skin: Negative.   Allergic/Immunologic: Negative.   Neurological: Negative.   Psychiatric/Behavioral: Negative.      There were no vitals taken for this visit. Physical Exam Constitutional:      Appearance: Normal appearance.  HENT:     Head: Normocephalic and atraumatic.     Right Ear: External ear normal.     Left Ear: External ear normal.     Nose: Nose normal.     Mouth/Throat:     Pharynx: Oropharynx is clear.  Eyes:     Conjunctiva/sclera: Conjunctivae normal.  Cardiovascular:     Rate and Rhythm: Normal rate and regular rhythm.     Pulses: Normal pulses.  Pulmonary:     Effort: Pulmonary effort is normal.     Breath sounds: Normal breath sounds.  Abdominal:     General: Bowel sounds are normal.  Musculoskeletal:     Cervical back: Normal range of motion.     Comments: Constitutional: General Appearance: healthy-appearing and NAD.  Psychiatric: Mood and Affect: normal mood and affect.  Cardiovascular System: Arterial Pulses Left: radial normal and brachial normal. Edema Left: none. Varicosities Right: no varicosities. Varicosities Left: no varicosities.  C-Spine/Neck: Active Range of Motion: flexion normal, extension normal, and no pain elicited on motion.  Shoulders: Inspection Left: no misalignment, atrophy, erythema, swelling, or scapular winging. Bony Palpation Left: no tenderness of the sternoclavicular joint, the coracoid process, the acromioclavicular joint, the bicipital groove, or the  scapula. Soft Tissue Palpation Left: no tenderness of the infraspinatus, the teres minor, the subacromial bursa, the axilla, the glenohumeral joint region, the pectoralis major insertion, the sternocleidomastoid, the costochondral junction, the trapezius, the rhomboid, the latissimus dorsi, the serratus, the deltoid, the levator scapulae, or the lateral cuff insertion and tenderness of the supraspinatus and the subdeltoid bursa. Active Range of Motion Left: limited. Special Tests Left: Speed's test negative and Neer's test positive. Stability Left: no laxity, sulcus sign negative, and anterior apprehension test negative. Strength Left: abduction 5/5, adduction 5/5, flexion 5/5, and extension 5/5.  Skin: Left Upper Extremity: normal.  Neurological System: Biceps Reflex Left: normal (2). Brachioradialis Reflex Left: normal (2). Triceps Reflex Left: normal (2). Sensation on the Left: C5 normal, C6 normal, and C7 normal.  Skin:    General: Skin is warm and dry.  Neurological:     Mental Status: She is alert.    Previous MRI demonstrates a 30% partial tear of the rotator cuff. This was interstitial tearing. 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 Bursitis and tendinitis.  Assessment/Plan Impression:  1. 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 Underlying interstitial tear of the rotator cuff. 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 We discussed shoulder arthroscopy possible clavicular plasty. In the interim we discussed steroid Dosepak. I had an extensive discussion with the patient concerning her pathology and relevant anatomy. To the persistence of their symptoms despite conservative treatment to include an exercise program, activity modification and injections we  discussed proceeding with a shoulder arthroscopy. Discussed the procedure in detail including the risks and benefits of improvement in symptoms, worsening of their symptoms, no changes in her symptoms. I discussed decompressing the subacromial space by a bursectomy and CA ligament release as well as acromioplasty if needed. Additionally discussed evaluating the labrum and biceps if appropriate. In addition I discussed the possibility that if an occult tear of the rotator cuff is noted that it may require a mini open rotator cuff repair. In addition discussed the outpatient procedure. Follow-up in 2 weeks. Pendulum exercises and appropriate postoperative analgesics.  A prescription for a steroid dose pack was given to reduce inflammation. 5 mg tablets to be taken over a 6 a day period of time in decreasing doses. The packaging provides guidance. In addition, a refill was given to be taken if persistent or recurrent pain is present. Maximum number of Sterapred dose packs in a year's period of time is approximately three. Any anti-inflammatory medication should be discontinued while using this dose pack but can be resumed following its completion. Side effects may include fluid retention, insomnia, and hyperactivity. If you are a diabetic monitor your blood glucose levels as steroids typically elevate those. You may need to supplement your current diabetes medication regiment accordingly. It is advised to consult with your medical physician for this.  Patient will call after the Dosepak if still symptomatic to proceed with scheduling of the arthroscopy. Preoperative clearance  Plan Left shoulder scope, SAD, possible DCR, possible mini-open RCR  Ellison Hughs M  Phoebe Sharps, PA-C for Dr Tonita Cong 07/28/2022, 8:21 AM

## 2022-07-28 NOTE — H&P (Signed)
Shelby Brandt is an 67 y.o. female.   Chief Complaint: left shoulder pain HPI: Visit For: Follow up Hand Dominance: right Location: left; shoulder Duration: >9 months Quality: aching; worsening Severity: pain level 5/10 Alleviating Factors: rest; limited weight bearing; OTC medication (tylenol prn); using voltaren gel Aggravating Factors: lifting; ROM; weightbearing Associated Symptoms: numbness; tingling Previous Surgery: none Prior Imaging: x ray; MRI Previous Injections: helped temporarily; 03/08/22, 10/25/21, 08/30/21 - only about 2 weeks of relief Previous PT: did not help Patient is minimal relief from her injections although definite for short period of time she been doing exercises at home without avail.  Past Medical History:  Diagnosis Date   Hypothyroidism    Vision abnormalities     Past Surgical History:  Procedure Laterality Date   KNEE ARTHROSCOPY Right 10/26/2012   Procedure: RIGHT KNEE ARTHROSCOPY WITH PARTIAL MEDIAL AND LATERAL  MENISECTOMY, DEBRIDEMENT;  Surgeon: Johnn Hai, MD;  Location: [facility];  Service: Orthopedics;  Laterality: Right;   MANDIBLE SURGERY  1994   and upper jaw   THYROIDECTOMY, PARTIAL  2010   right   TONSILLECTOMY     as adult    Family History  Problem Relation Age of Onset   Macular degeneration Mother    Glaucoma Mother    Atrial fibrillation Mother    Hypertension Mother    Hypertension Father    Atrial fibrillation Father    COPD Father    Breast cancer Neg Hx    Social History:  reports that she has never smoked. She has never used smokeless tobacco. She reports current alcohol use. She reports that she does not use drugs.  Allergies:  Allergies  Allergen Reactions   Ivp Dye [Iodinated Contrast Media] Hives    respiratory problems    Current meds: albuterol sulfate HFA 90 mcg/actuation aerosol inhaler azelastine 137 mcg-fluticasone 50 mcg/spray nasal spray EPINEPHrine 0.3 mg/0.3 mL injection,  auto-injector HYDROcodone 5 mg-acetaminophen 325 mg tablet levothyroxine 75 mcg tablet lisinopriL 5 mg tablet nitrofurantoin monohydrate/macrocrystals 100 mg capsule omeprazole 40 mg capsule,delayed release  Review of Systems  Constitutional: Negative.   HENT: Negative.    Eyes: Negative.   Respiratory: Negative.    Cardiovascular: Negative.   Gastrointestinal: Negative.   Endocrine: Negative.   Genitourinary: Negative.   Musculoskeletal:  Positive for arthralgias.  Skin: Negative.   Allergic/Immunologic: Negative.   Neurological: Negative.   Psychiatric/Behavioral: Negative.      There were no vitals taken for this visit. Physical Exam Constitutional:      Appearance: Normal appearance.  HENT:     Head: Normocephalic and atraumatic.     Right Ear: External ear normal.     Left Ear: External ear normal.     Nose: Nose normal.     Mouth/Throat:     Pharynx: Oropharynx is clear.  Eyes:     Conjunctiva/sclera: Conjunctivae normal.  Cardiovascular:     Rate and Rhythm: Normal rate and regular rhythm.     Pulses: Normal pulses.  Pulmonary:     Effort: Pulmonary effort is normal.     Breath sounds: Normal breath sounds.  Abdominal:     General: Bowel sounds are normal.  Musculoskeletal:     Cervical back: Normal range of motion.     Comments: Constitutional: General Appearance: healthy-appearing and NAD.  Psychiatric: Mood and Affect: normal mood and affect.  Cardiovascular System: Arterial Pulses Left: radial normal and brachial normal. Edema Left: none. Varicosities Right: no varicosities. Varicosities Left: no varicosities.  C-Spine/Neck: Active Range of Motion: flexion normal, extension normal, and no pain elicited on motion.  Shoulders: Inspection Left: no misalignment, atrophy, erythema, swelling, or scapular winging. Bony Palpation Left: no tenderness of the sternoclavicular joint, the coracoid process, the acromioclavicular joint, the bicipital groove, or the  scapula. Soft Tissue Palpation Left: no tenderness of the infraspinatus, the teres minor, the subacromial bursa, the axilla, the glenohumeral joint region, the pectoralis major insertion, the sternocleidomastoid, the costochondral junction, the trapezius, the rhomboid, the latissimus dorsi, the serratus, the deltoid, the levator scapulae, or the lateral cuff insertion and tenderness of the supraspinatus and the subdeltoid bursa. Active Range of Motion Left: limited. Special Tests Left: Speed's test negative and Neer's test positive. Stability Left: no laxity, sulcus sign negative, and anterior apprehension test negative. Strength Left: abduction 5/5, adduction 5/5, flexion 5/5, and extension 5/5.  Skin: Left Upper Extremity: normal.  Neurological System: Biceps Reflex Left: normal (2). Brachioradialis Reflex Left: normal (2). Triceps Reflex Left: normal (2). Sensation on the Left: C5 normal, C6 normal, and C7 normal.  Skin:    General: Skin is warm and dry.  Neurological:     Mental Status: She is alert.    Previous MRI demonstrates a 30% partial tear of the rotator cuff. This was interstitial tearing. 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 Bursitis and tendinitis.  Assessment/Plan Impression:  1. 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 Underlying interstitial tear of the rotator cuff. 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 We discussed shoulder arthroscopy possible clavicular plasty. In the interim we discussed steroid Dosepak. I had an extensive discussion with the patient concerning her pathology and relevant anatomy. To the persistence of their symptoms despite conservative treatment to include an exercise program, activity modification and injections we  discussed proceeding with a shoulder arthroscopy. Discussed the procedure in detail including the risks and benefits of improvement in symptoms, worsening of their symptoms, no changes in her symptoms. I discussed decompressing the subacromial space by a bursectomy and CA ligament release as well as acromioplasty if needed. Additionally discussed evaluating the labrum and biceps if appropriate. In addition I discussed the possibility that if an occult tear of the rotator cuff is noted that it may require a mini open rotator cuff repair. In addition discussed the outpatient procedure. Follow-up in 2 weeks. Pendulum exercises and appropriate postoperative analgesics.  A prescription for a steroid dose pack was given to reduce inflammation. 5 mg tablets to be taken over a 6 a day period of time in decreasing doses. The packaging provides guidance. In addition, a refill was given to be taken if persistent or recurrent pain is present. Maximum number of Sterapred dose packs in a year's period of time is approximately three. Any anti-inflammatory medication should be discontinued while using this dose pack but can be resumed following its completion. Side effects may include fluid retention, insomnia, and hyperactivity. If you are a diabetic monitor your blood glucose levels as steroids typically elevate those. You may need to supplement your current diabetes medication regiment accordingly. It is advised to consult with your medical physician for this.  Patient will call after the Dosepak if still symptomatic to proceed with scheduling of the arthroscopy. Preoperative clearance  Plan Left shoulder scope, SAD, possible DCR, possible mini-open RCR  Ellison Hughs M  Phoebe Sharps, PA-C for Dr Tonita Cong 07/28/2022, 8:21 AM

## 2022-08-01 ENCOUNTER — Ambulatory Visit
Admission: RE | Admit: 2022-08-01 | Discharge: 2022-08-01 | Disposition: A | Discharge status: Home / Self Care | Payer: Managed Care, Other (non HMO) | Source: Ambulatory Visit | Attending: Obstetrics and Gynecology | Admitting: Obstetrics and Gynecology

## 2022-08-01 DIAGNOSIS — Z1231 Encounter for screening mammogram for malignant neoplasm of breast: Secondary | ICD-10-CM

## 2022-08-04 NOTE — Patient Instructions (Signed)
DUE TO COVID-19 ONLY TWO VISITORS  (aged 65 and older)  ARE ALLOWED TO COME WITH YOU AND STAY IN THE WAITING ROOM ONLY DURING PRE OP AND PROCEDURE.   **NO VISITORS ARE ALLOWED IN THE SHORT STAY AREA OR RECOVERY ROOM!!**  IF YOU WILL BE ADMITTED INTO THE HOSPITAL YOU ARE ALLOWED ONLY FOUR SUPPORT PEOPLE DURING VISITATION HOURS ONLY (7 AM -8PM)   The support person(s) must pass our screening, gel in and out, and wear a mask at all times, including in the patient's room. Patients must also wear a mask when staff or their support person are in the room. Visitors GUEST BADGE MUST BE WORN VISIBLY  One adult visitor may remain with you overnight and MUST be in the room by 8 P.M.     Your procedure is scheduled on: 08/18/22   Report to [facility] Main Entrance    Report to admitting at  7:30 AM   Call this number if you have problems the morning of surgery [phone]   Do not eat food :After Midnight.   After Midnight you may have the following liquids until _6:45_____ AM/  DAY OF SURGERY  Water Black Coffee (sugar ok, NO MILK/CREAM OR CREAMERS)  Tea (sugar ok, NO MILK/CREAM OR CREAMERS) regular and decaf                             Plain Jell-O (NO RED)                                           Fruit ices (not with fruit pulp, NO RED)                                     Popsicles (NO RED)                                                                  Juice: apple, WHITE grape, WHITE cranberry Sports drinks like Gatorade (NO RED)      The day of surgery:  Drink ONE (1) Pre-Surgery Clear Ensure  at 7:15  AM the morning of surgery. Drink in one sitting. Do not sip.  This drink was given to you during your hospital  pre-op appointment visit. Nothing else to drink after completing the  Pre-Surgery Clear Ensure at 7:30 AM          If you have questions, please contact your surgeon's office.    Oral Hygiene is also important to reduce your risk of infection.                                     Remember - BRUSH YOUR TEETH THE MORNING OF SURGERY WITH YOUR REGULAR TOOTHPASTE  DENTURES WILL BE REMOVED PRIOR TO SURGERY PLEASE DO NOT APPLY "Poly grip" OR ADHESIVES!!!   Do NOT smoke after Midnight   Take these medicines the morning of surgery with A SIP OF WATER: Synthroid  Bring CPAP mask and tubing day of surgery.                              You may not have any metal on your body including hair pins, jewelry, and body piercing             Do not wear make-up, lotions, powders, perfumes/cologne, or deodorant  Do not wear nail polish including gel and S&S, artificial/acrylic nails, or any other type of covering on natural nails including finger and toenails. If you have artificial nails, gel coating, etc. that needs to be removed by a nail salon please have this removed prior to surgery or surgery may need to be canceled/ delayed if the surgeon/ anesthesia feels like they are unable to be safely monitored.   Do not shave  48 hours prior to surgery.     Do not bring valuables to the hospital. [facility] IS NOT             RESPONSIBLE   FOR VALUABLES.   Contacts, glasses, or bridgework may not be worn into surgery.   Bring small overnight bag day of surgery.   DO NOT BRING YOUR HOME MEDICATIONS TO THE HOSPITAL. PHARMACY WILL DISPENSE MEDICATIONS LISTED ON YOUR MEDICATION LIST TO YOU DURING YOUR ADMISSION IN THE HOSPITAL!    Marland Kitchen   Special Instructions: Bring a copy of your healthcare power of attorney and living will documents the day of surgery if you haven't scanned them before.              Please read over the following fact sheets you were given: IF YOU HAVE QUESTIONS ABOUT YOUR PRE-OP INSTRUCTIONS PLEASE CALL [phone]   [place]- Preparing for Total Shoulder Arthroplasty    Before surgery, you can play an important role. Because skin is not sterile, your skin needs to be as free of germs as possible. You can reduce the number of germs on your  skin by using the following products. Benzoyl Peroxide Gel Reduces the number of germs present on the skin Applied twice a day to shoulder area starting two days before surgery    ==================================================================  Please follow these instructions carefully:  BENZOYL PEROXIDE 5% GEL  Please do not use if you have an allergy to benzoyl peroxide.   If your skin becomes reddened/irritated stop using the benzoyl peroxide.  Starting two days before surgery, apply as follows: Apply benzoyl peroxide in the morning and at night. Apply after taking a shower. If you are not taking a shower clean entire shoulder front, back, and side along with the armpit with a clean wet washcloth.  Place a quarter-sized dollop on your shoulder and rub in thoroughly, making sure to cover the front, back, and side of your shoulder, along with the armpit.   2 days before ____ AM   ____ PM              1 day before ____ AM   ____ PM                         Do this twice a day for two days.  (Last application is the night before surgery, AFTER using the CHG soap as described below).  Do NOT apply benzoyl peroxide gel on the day of surgery.     [place] - Preparing for Surgery Before surgery, you can play an  important role.  Because skin is not sterile, your skin needs to be as free of germs as possible.  You can reduce the number of germs on your skin by washing with CHG (chlorahexidine gluconate) soap before surgery.  CHG is an antiseptic cleaner which kills germs and bonds with the skin to continue killing germs even after washing. Please DO NOT use if you have an allergy to CHG or antibacterial soaps.  If your skin becomes reddened/irritated stop using the CHG and inform your nurse when you arrive at Short Stay. Do not shave (including legs and underarms) for at least 48 hours prior to the first CHG shower.   Please follow these instructions carefully:  1.  Shower with CHG Soap  the night before surgery and the  morning of Surgery.  2.  If you choose to wash your hair, wash your hair first as usual with your  normal  shampoo.  3.  After you shampoo, rinse your hair and body thoroughly to remove the  shampoo.                            4.  Use CHG as you would any other liquid soap.  You can apply chg directly  to the skin and wash                       Gently with a scrungie or clean washcloth.  5.  Apply the CHG Soap to your body ONLY FROM THE NECK DOWN.   Do not use on face/ open                           Wound or open sores. Avoid contact with eyes, ears mouth and genitals (private parts).                       Wash face,  Genitals (private parts) with your normal soap.             6.  Wash thoroughly, paying special attention to the area where your surgery  will be performed.  7.  Thoroughly rinse your body with warm water from the neck down.  8.  DO NOT shower/wash with your normal soap after using and rinsing off  the CHG Soap.             9.  Pat yourself dry with a clean towel.            10.  Wear clean pajamas.            11.  Place clean sheets on your bed the night of your first shower and do not  sleep with pets. Day of Surgery : Do not apply any lotions/deodorants the morning of surgery.  Please wear clean clothes to the hospital/surgery center.  FAILURE TO FOLLOW THESE INSTRUCTIONS MAY RESULT IN THE CANCELLATION OF YOUR SURGERY     Incentive Spirometer  An incentive spirometer is a tool that can help keep your lungs clear and active. This tool measures how well you are filling your lungs with each breath. Taking long deep breaths may help reverse or decrease the chance of developing breathing (pulmonary) problems (especially infection) following: A long period of time when you are unable to move or be active. BEFORE THE PROCEDURE  If the spirometer includes an indicator to show your best effort,  your nurse or respiratory therapist will set it to a  desired goal. If possible, sit up straight or lean slightly forward. Try not to slouch. Hold the incentive spirometer in an upright position. INSTRUCTIONS FOR USE  Sit on the edge of your bed if possible, or sit up as far as you can in bed or on a chair. Hold the incentive spirometer in an upright position. Breathe out normally. Place the mouthpiece in your mouth and seal your lips tightly around it. Breathe in slowly and as deeply as possible, raising the piston or the ball toward the top of the column. Hold your breath for 3-5 seconds or for as long as possible. Allow the piston or ball to fall to the bottom of the column. Remove the mouthpiece from your mouth and breathe out normally. Rest for a few seconds and repeat Steps 1 through 7 at least 10 times every 1-2 hours when you are awake. Take your time and take a few normal breaths between deep breaths. The spirometer may include an indicator to show your best effort. Use the indicator as a goal to work toward during each repetition. After each set of 10 deep breaths, practice coughing to be sure your lungs are clear. If you have an incision (the cut made at the time of surgery), support your incision when coughing by placing a pillow or rolled up towels firmly against it. Once you are able to get out of bed, walk around indoors and cough well. You may stop using the incentive spirometer when instructed by your caregiver.  RISKS AND COMPLICATIONS Take your time so you do not get dizzy or light-headed. If you are in pain, you may need to take or ask for pain medication before doing incentive spirometry. It is harder to take a deep breath if you are having pain. AFTER USE Rest and breathe slowly and easily. It can be helpful to keep track of a log of your progress. Your caregiver can provide you with a simple table to help with this. If you are using the spirometer at home, follow these instructions: [place] IF:  You are having  difficultly using the spirometer. You have trouble using the spirometer as often as instructed. Your pain medication is not giving enough relief while using the spirometer. You develop fever of 100.5 F (38.1 C) or higher. SEEK IMMEDIATE MEDICAL CARE IF:  You cough up bloody sputum that had not been present before. You develop fever of 102 F (38.9 C) or greater. You develop worsening pain at or near the incision site. MAKE SURE YOU:  Understand these instructions. Will watch your condition. Will get help right away if you are not doing well or get worse. Document Released: 10/24/2006 Document Revised: 09/05/2011 Document Reviewed: 12/25/2006 [facility] Patient Information 2014 [employer], [place].   ________________________________________________________________________ ________________________________________________________________________

## 2022-08-08 ENCOUNTER — Encounter (HOSPITAL_COMMUNITY)
Admission: RE | Admit: 2022-08-08 | Discharge: 2022-08-08 | Disposition: A | Discharge status: Home / Self Care | Payer: Managed Care, Other (non HMO) | Source: Ambulatory Visit | Attending: Family Medicine | Admitting: Family Medicine

## 2022-08-08 NOTE — Progress Notes (Signed)
Anesthesia Review:  PCP: Cardiologist : Chest x-ray : EKG : Echo : Stress test: Cardiac Cath :  Activity level:  Sleep Study/ CPAP : Fasting Blood Sugar :      / Checks Blood Sugar -- times a day:   Blood Thinner/ Instructions /Last Dose: ASA / Instructions/ Last Dose :  

## 2022-08-09 NOTE — Patient Instructions (Signed)
SURGICAL WAITING ROOM VISITATION  Patients having surgery or a procedure may have no more than 2 support people in the waiting area - these visitors may rotate.    Children under the age of 71 must have an adult with them who is not the patient.  Due to an increase in RSV and influenza rates and associated hospitalizations, children ages 18 and under may not visit patients in [place].  If the patient needs to stay at the hospital during part of their recovery, the visitor guidelines for inpatient rooms apply. Pre-op nurse will coordinate an appropriate time for 1 support person to accompany patient in pre-op.  This support person may not rotate.    Please refer to the [facility] website for the visitor guidelines for Inpatients (after your surgery is over and you are in a regular room).       Your procedure is scheduled on:  08/18/2022    Report to [facility] Main Entrance    Report to admitting at   [phone]   Call this number if you have problems the morning of surgery [phone]   Do not eat food :After Midnight.   After Midnight you may have the following liquids until _0645_____ AM DAY OF SURGERY  Water Non-Citrus Juices (without pulp, NO RED-Apple, White grape, White cranberry) Black Coffee (NO MILK/CREAM OR CREAMERS, sugar ok)  Clear Tea (NO MILK/CREAM OR CREAMERS, sugar ok) regular and decaf                             Plain Jell-O (NO RED)                                           Fruit ices (not with fruit pulp, NO RED)                                     Popsicles (NO RED)                                                               Sports drinks like Gatorade (NO RED)                     The day of surgery:  Drink ONE (1) Pre-Surgery Clear Ensure or G2 at   [phone] ( have completed by )  the morning of surgery. Drink in one sitting. Do not sip.  This drink was given to you during your hospital  pre-op appointment visit. Nothing else to  drink after completing the  Pre-Surgery Clear Ensure or G2.          If you have questions, please contact your surgeon's office.    Oral Hygiene is also important to reduce your risk of infection.                                    Remember - BRUSH YOUR TEETH THE MORNING OF SURGERY WITH YOUR REGULAR TOOTHPASTE  DENTURES WILL BE REMOVED PRIOR TO SURGERY PLEASE DO NOT APPLY "Poly grip" OR ADHESIVES!!!   Do NOT smoke after Midnight   Take these medicines the morning of surgery with A SIP OF WATER:  synthroid, omeprazole if needed   DO NOT TAKE ANY ORAL DIABETIC MEDICATIONS DAY OF YOUR SURGERY  Bring CPAP mask and tubing day of surgery.                              You may not have any metal on your body including hair pins, jewelry, and body piercing             Do not wear make-up, lotions, powders, perfumes/cologne, or deodorant  Do not wear nail polish including gel and S&S, artificial/acrylic nails, or any other type of covering on natural nails including finger and toenails. If you have artificial nails, gel coating, etc. that needs to be removed by a nail salon please have this removed prior to surgery or surgery may need to be canceled/ delayed if the surgeon/ anesthesia feels like they are unable to be safely monitored.   Do not shave  48 hours prior to surgery.               Men may shave face and neck.   Do not bring valuables to the hospital. [place].   Contacts, glasses, dentures or bridgework may not be worn into surgery.   Bring small overnight bag day of surgery.   DO NOT [place]. PHARMACY WILL DISPENSE MEDICATIONS LISTED ON YOUR MEDICATION LIST TO YOU DURING YOUR ADMISSION [place]!    Patients discharged on the day of surgery will not be allowed to drive home.  Someone NEEDS to stay with you for the first 24 hours after anesthesia.   Special Instructions: Bring a copy  of your healthcare power of attorney and living will documents the day of surgery if you haven't scanned them before.              Please read over the following fact sheets you were given: IF [place] [phone]   If you received a COVID test during your pre-op visit  it is requested that you wear a mask when out in public, stay away from anyone that may not be feeling well and notify your surgeon if you develop symptoms. If you test positive for Covid or have been in contact with anyone that has tested positive in the last 10 days please notify you surgeon.    [place] - Preparing for Surgery Before surgery, you can play an important role.  Because skin is not sterile, your skin needs to be as free of germs as possible.  You can reduce the number of germs on your skin by washing with CHG (chlorahexidine gluconate) soap before surgery.  CHG is an antiseptic cleaner which kills germs and bonds with the skin to continue killing germs even after washing. Please DO NOT use if you have an allergy to CHG or antibacterial soaps.  If your skin becomes reddened/irritated stop using the CHG and inform your nurse when you arrive at Short Stay. Do not shave (including legs and underarms) for at least 48 hours prior to the first CHG shower.  You may shave your face/neck. Please follow these instructions carefully:  1.  Shower with CHG Soap the night before surgery and the  morning of Surgery.  2.  If you choose to wash your hair, wash your hair first as usual with your  normal  shampoo.  3.  After you shampoo, rinse your hair and body thoroughly to remove the  shampoo.                           4.  Use CHG as you would any other liquid soap.  You can apply chg directly  to the skin and wash                       Gently with a scrungie or clean washcloth.  5.  Apply the CHG Soap to your body ONLY FROM THE NECK DOWN.   Do not use on face/ open                            Wound or open sores. Avoid contact with eyes, ears mouth and genitals (private parts).                       Wash face,  Genitals (private parts) with your normal soap.             6.  Wash thoroughly, paying special attention to the area where your surgery  will be performed.  7.  Thoroughly rinse your body with warm water from the neck down.  8.  DO NOT shower/wash with your normal soap after using and rinsing off  the CHG Soap.                9.  Pat yourself dry with a clean towel.            10.  Wear clean pajamas.            11.  Place clean sheets on your bed the night of your first shower and do not  sleep with pets. Day of Surgery : Do not apply any lotions/deodorants the morning of surgery.  Please wear clean clothes to the hospital/surgery center.  FAILURE TO FOLLOW THESE INSTRUCTIONS MAY RESULT IN THE CANCELLATION OF YOUR SURGERY PATIENT SIGNATURE_________________________________  NURSE SIGNATURE__________________________________  ________________________________________________________________________

## 2022-08-10 ENCOUNTER — Other Ambulatory Visit: Payer: Self-pay

## 2022-08-10 ENCOUNTER — Encounter (HOSPITAL_COMMUNITY)
Admission: RE | Admit: 2022-08-10 | Discharge: 2022-08-10 | Disposition: A | Discharge status: Home / Self Care | Payer: Managed Care, Other (non HMO) | Source: Ambulatory Visit | Attending: Specialist | Admitting: Specialist

## 2022-08-10 ENCOUNTER — Encounter (HOSPITAL_COMMUNITY): Payer: Self-pay

## 2022-08-10 VITALS — BP 129/89 | HR 61 | Temp 98.3°F | Resp 16 | Ht 68.0 in | Wt 174.0 lb

## 2022-08-10 DIAGNOSIS — R9431 Abnormal electrocardiogram [ECG] [EKG]: Secondary | ICD-10-CM | POA: Diagnosis not present

## 2022-08-10 DIAGNOSIS — Z01818 Encounter for other preprocedural examination: Secondary | ICD-10-CM | POA: Insufficient documentation

## 2022-08-10 HISTORY — DX: Unspecified osteoarthritis, unspecified site: M19.90

## 2022-08-10 HISTORY — DX: Gastro-esophageal reflux disease without esophagitis: K21.9

## 2022-08-10 HISTORY — DX: Essential (primary) hypertension: I10

## 2022-08-10 LAB — BASIC METABOLIC PANEL
Anion gap: 6 (ref 5–15)
BUN: 21 mg/dL (ref 8–23)
CO2: 26 mmol/L (ref 22–32)
Calcium: 8.9 mg/dL (ref 8.9–10.3)
Chloride: 106 mmol/L (ref 98–111)
Creatinine, Ser: 1.03 mg/dL — ABNORMAL HIGH (ref 0.44–1.00)
GFR, Estimated: >60 mL/min (ref >60)
Glucose, Bld: 95 mg/dL (ref 70–99)
Potassium: 4.2 mmol/L (ref 3.5–5.1)
Sodium: 138 mmol/L (ref 135–145)

## 2022-08-10 LAB — CBC
HCT: 42.4 % (ref 36.0–46.0)
Hemoglobin: 13.7 g/dL (ref 12.0–15.0)
MCH: 29.5 pg (ref 26.0–34.0)
MCHC: 32.3 g/dL (ref 30.0–36.0)
MCV: 91.4 fL (ref 80.0–100.0)
Platelets: 227 10*3/uL (ref 150–400)
RBC: 4.64 MIL/uL (ref 3.87–5.11)
RDW: 12.7 % (ref 11.5–15.5)
WBC: 4 10*3/uL (ref 4.0–10.5)
nRBC: 0 % (ref 0.0–0.2)

## 2022-08-17 NOTE — Anesthesia Preprocedure Evaluation (Signed)
Anesthesia Evaluation  Patient identified by MRN, date of birth, ID band Patient awake    Reviewed: Allergy & Precautions, NPO status , Patient's Chart, lab work & pertinent test results  Airway Mallampati: II  TM Distance: >3 FB Neck ROM: Full    Dental no notable dental hx. (+) Teeth Intact, Dental Advisory Given   Pulmonary    Pulmonary exam normal breath sounds clear to auscultation       Cardiovascular hypertension, Normal cardiovascular exam Rhythm:Regular Rate:Normal     Neuro/Psych negative neurological ROS     GI/Hepatic Neg liver ROS,GERD  ,,  Endo/Other  Hypothyroidism  Lab Results      Component                Value               Date                      CREATININE               1.03 (H)            08/10/2022                BUN                      21                  08/10/2022                NA                       138                 08/10/2022                K                        4.2                 08/10/2022                CL                       106                 08/10/2022                CO2                      26                  08/10/2022             Renal/GU Lab Results      Component                Value               Date                      CREATININE               1.03 (H)            08/10/2022                BUN  21                  08/10/2022                NA                       138                 08/10/2022                K                        4.2                 08/10/2022                    Musculoskeletal  (+) Arthritis ,    Abdominal   Peds  Hematology Lab Results      Component                Value               Date                      WBC                      4.0                 08/10/2022                HGB                      13.7                08/10/2022                HCT                      42.4                08/10/2022                     PLT                      227                 08/10/2022                Anesthesia Other Findings   Reproductive/Obstetrics                             Anesthesia Physical Anesthesia Plan  ASA: 3  Anesthesia Plan: General   Post-op Pain Management: Regional block* and Minimal or no pain anticipated   Induction: Intravenous  PONV Risk Score and Plan: 4 or greater and Treatment may vary due to age or medical condition, Midazolam, Ondansetron and Dexamethasone  Airway Management Planned: Oral ETT  Additional Equipment: None  Intra-op Plan:   Post-operative Plan: Extubation in OR  Informed Consent: I have reviewed the patients History and Physical, chart, labs and discussed the procedure including the risks, benefits and alternatives for the proposed anesthesia with the patient or authorized representative who has indicated his/her understanding and acceptance.     Dental advisory given  Plan Discussed with:   Anesthesia Plan Comments: (GA w L ISB)        Anesthesia Quick Evaluation

## 2022-08-18 ENCOUNTER — Ambulatory Visit (HOSPITAL_COMMUNITY): Payer: Managed Care, Other (non HMO) | Admitting: Physician Assistant

## 2022-08-18 ENCOUNTER — Encounter (HOSPITAL_COMMUNITY)
Admission: RE | Disposition: A | Discharge status: Home / Self Care | Payer: Self-pay | Source: Ambulatory Visit | Attending: Specialist

## 2022-08-18 ENCOUNTER — Ambulatory Visit (HOSPITAL_COMMUNITY)
Admission: RE | Admit: 2022-08-18 | Discharge: 2022-08-18 | Disposition: A | Discharge status: Home / Self Care | Payer: Managed Care, Other (non HMO) | Source: Ambulatory Visit | Attending: Specialist | Admitting: Specialist

## 2022-08-18 ENCOUNTER — Encounter (HOSPITAL_COMMUNITY): Payer: Self-pay | Admitting: Specialist

## 2022-08-18 ENCOUNTER — Other Ambulatory Visit: Payer: Self-pay

## 2022-08-18 ENCOUNTER — Ambulatory Visit (HOSPITAL_BASED_OUTPATIENT_CLINIC_OR_DEPARTMENT_OTHER): Payer: Managed Care, Other (non HMO) | Admitting: Anesthesiology

## 2022-08-18 DIAGNOSIS — M75102 Unspecified rotator cuff tear or rupture of left shoulder, not specified as traumatic: Secondary | ICD-10-CM

## 2022-08-18 DIAGNOSIS — M19012 Primary osteoarthritis, left shoulder: Secondary | ICD-10-CM | POA: Diagnosis not present

## 2022-08-18 DIAGNOSIS — M7542 Impingement syndrome of left shoulder: Secondary | ICD-10-CM | POA: Insufficient documentation

## 2022-08-18 DIAGNOSIS — M75112 Incomplete rotator cuff tear or rupture of left shoulder, not specified as traumatic: Secondary | ICD-10-CM | POA: Insufficient documentation

## 2022-08-18 DIAGNOSIS — E039 Hypothyroidism, unspecified: Secondary | ICD-10-CM | POA: Diagnosis not present

## 2022-08-18 DIAGNOSIS — X58XXXA Exposure to other specified factors, initial encounter: Secondary | ICD-10-CM | POA: Insufficient documentation

## 2022-08-18 DIAGNOSIS — I10 Essential (primary) hypertension: Secondary | ICD-10-CM

## 2022-08-18 DIAGNOSIS — K219 Gastro-esophageal reflux disease without esophagitis: Secondary | ICD-10-CM | POA: Insufficient documentation

## 2022-08-18 DIAGNOSIS — S43492A Other sprain of left shoulder joint, initial encounter: Secondary | ICD-10-CM | POA: Insufficient documentation

## 2022-08-18 DIAGNOSIS — Z01818 Encounter for other preprocedural examination: Secondary | ICD-10-CM

## 2022-08-18 HISTORY — PX: SHOULDER ARTHROSCOPY WITH ROTATOR CUFF REPAIR AND SUBACROMIAL DECOMPRESSION: SHX5686

## 2022-08-18 SURGERY — SHOULDER ARTHROSCOPY WITH ROTATOR CUFF REPAIR AND SUBACROMIAL DECOMPRESSION
Anesthesia: General | Laterality: Left

## 2022-08-18 MED ORDER — ONDANSETRON HCL 4 MG/2ML IJ SOLN
INTRAMUSCULAR | Status: DC | PRN
  Administered 2022-08-18: 4 mg via INTRAVENOUS

## 2022-08-18 MED ORDER — POLYETHYLENE GLYCOL 3350 17 G PO PACK: 17.0000 g | PACK | Freq: Every day | ORAL | 0 refills | Status: AC

## 2022-08-18 MED ORDER — ACETAMINOPHEN 10 MG/ML IV SOLN: 1000.0000 mg | Freq: Once | INTRAVENOUS | Status: DC | PRN

## 2022-08-18 MED ORDER — DEXAMETHASONE SODIUM PHOSPHATE 10 MG/ML IJ SOLN
INTRAMUSCULAR | Status: AC
  Filled 2022-08-18: qty 1

## 2022-08-18 MED ORDER — SODIUM CHLORIDE 0.9 % IR SOLN
Status: DC | PRN
  Administered 2022-08-18 (×2): 3000 mL

## 2022-08-18 MED ORDER — ORAL CARE MOUTH RINSE: 15.0000 mL | Freq: Once | OROMUCOSAL | Status: AC

## 2022-08-18 MED ORDER — ONDANSETRON HCL 4 MG/2ML IJ SOLN: 4.0000 mg | Freq: Once | INTRAMUSCULAR | Status: DC | PRN

## 2022-08-18 MED ORDER — PROPOFOL 10 MG/ML IV BOLUS
INTRAVENOUS | Status: AC
  Filled 2022-08-18: qty 20

## 2022-08-18 MED ORDER — FENTANYL CITRATE PF 50 MCG/ML IJ SOSY
PREFILLED_SYRINGE | INTRAMUSCULAR | Status: AC
  Administered 2022-08-18: 50 ug
  Filled 2022-08-18: qty 2

## 2022-08-18 MED ORDER — CHLORHEXIDINE GLUCONATE 0.12 % MT SOLN
15.0000 mL | Freq: Once | OROMUCOSAL | Status: AC
  Administered 2022-08-18: 15 mL via OROMUCOSAL

## 2022-08-18 MED ORDER — LACTATED RINGERS IV SOLN: INTRAVENOUS | Status: DC

## 2022-08-18 MED ORDER — DOCUSATE SODIUM 100 MG PO CAPS: 100.0000 mg | ORAL_CAPSULE | Freq: Two times a day (BID) | ORAL | 1 refills | Status: AC | PRN

## 2022-08-18 MED ORDER — TRANEXAMIC ACID-NACL 1000-0.7 MG/100ML-% IV SOLN
1000.0000 mg | INTRAVENOUS | Status: AC
  Administered 2022-08-18: 1000 mg via INTRAVENOUS
  Filled 2022-08-18: qty 100

## 2022-08-18 MED ORDER — ROCURONIUM BROMIDE 10 MG/ML (PF) SYRINGE
PREFILLED_SYRINGE | INTRAVENOUS | Status: DC | PRN
  Administered 2022-08-18: 40 mg via INTRAVENOUS

## 2022-08-18 MED ORDER — BUPIVACAINE LIPOSOME 1.3 % IJ SUSP
INTRAMUSCULAR | Status: DC | PRN
  Administered 2022-08-18: 10 mL via PERINEURAL

## 2022-08-18 MED ORDER — ONDANSETRON HCL 4 MG/2ML IJ SOLN
INTRAMUSCULAR | Status: AC
  Filled 2022-08-18: qty 2

## 2022-08-18 MED ORDER — ROCURONIUM BROMIDE 10 MG/ML (PF) SYRINGE
PREFILLED_SYRINGE | INTRAVENOUS | Status: AC
  Filled 2022-08-18: qty 10

## 2022-08-18 MED ORDER — DEXAMETHASONE SODIUM PHOSPHATE 10 MG/ML IJ SOLN
INTRAMUSCULAR | Status: DC | PRN
  Administered 2022-08-18: 10 mg via INTRAVENOUS

## 2022-08-18 MED ORDER — CEFAZOLIN SODIUM-DEXTROSE 2-4 GM/100ML-% IV SOLN
2.0000 g | INTRAVENOUS | Status: AC
  Administered 2022-08-18: 2 g via INTRAVENOUS
  Filled 2022-08-18: qty 100

## 2022-08-18 MED ORDER — PHENYLEPHRINE HCL-NACL 20-0.9 MG/250ML-% IV SOLN
INTRAVENOUS | Status: DC | PRN
  Administered 2022-08-18: 20 ug/min via INTRAVENOUS

## 2022-08-18 MED ORDER — BUPIVACAINE HCL (PF) 0.5 % IJ SOLN
INTRAMUSCULAR | Status: DC | PRN
  Administered 2022-08-18: 15 mL via PERINEURAL

## 2022-08-18 MED ORDER — FENTANYL CITRATE (PF) 100 MCG/2ML IJ SOLN
INTRAMUSCULAR | Status: AC
  Filled 2022-08-18: qty 2

## 2022-08-18 MED ORDER — FENTANYL CITRATE (PF) 100 MCG/2ML IJ SOLN
INTRAMUSCULAR | Status: DC | PRN
  Administered 2022-08-18: 25 ug via INTRAVENOUS
  Administered 2022-08-18: 75 ug via INTRAVENOUS

## 2022-08-18 MED ORDER — EPINEPHRINE PF 1 MG/ML IJ SOLN
INTRAMUSCULAR | Status: AC
  Filled 2022-08-18: qty 1

## 2022-08-18 MED ORDER — LIDOCAINE HCL (PF) 2 % IJ SOLN
INTRAMUSCULAR | Status: AC
  Filled 2022-08-18: qty 5

## 2022-08-18 MED ORDER — LIDOCAINE 2% (20 MG/ML) 5 ML SYRINGE
INTRAMUSCULAR | Status: DC | PRN
  Administered 2022-08-18: 100 mg via INTRAVENOUS

## 2022-08-18 MED ORDER — ASPIRIN 81 MG PO TBEC: 81.0000 mg | DELAYED_RELEASE_TABLET | Freq: Every day | ORAL | 0 refills | Status: AC

## 2022-08-18 MED ORDER — EPHEDRINE SULFATE-NACL 50-0.9 MG/10ML-% IV SOSY
PREFILLED_SYRINGE | INTRAVENOUS | Status: DC | PRN
  Administered 2022-08-18: 5 mg via INTRAVENOUS

## 2022-08-18 MED ORDER — PROPOFOL 10 MG/ML IV BOLUS
INTRAVENOUS | Status: DC | PRN
  Administered 2022-08-18: 170 mg via INTRAVENOUS

## 2022-08-18 MED ORDER — BUPIVACAINE HCL (PF) 0.5 % IJ SOLN
INTRAMUSCULAR | Status: AC
  Filled 2022-08-18: qty 30

## 2022-08-18 MED ORDER — OXYCODONE HCL 5 MG/5ML PO SOLN: 5.0000 mg | Freq: Once | ORAL | Status: DC | PRN

## 2022-08-18 MED ORDER — OXYCODONE HCL 5 MG PO TABS: 5.0000 mg | ORAL_TABLET | Freq: Once | ORAL | Status: DC | PRN

## 2022-08-18 MED ORDER — AMISULPRIDE (ANTIEMETIC) 5 MG/2ML IV SOLN: 10.0000 mg | Freq: Once | INTRAVENOUS | Status: DC | PRN

## 2022-08-18 MED ORDER — HYDROMORPHONE HCL 1 MG/ML IJ SOLN: 0.2500 mg | INTRAMUSCULAR | Status: DC | PRN

## 2022-08-18 MED ORDER — EPINEPHRINE PF 1 MG/ML IJ SOLN
INTRAMUSCULAR | Status: DC | PRN
  Administered 2022-08-18: 1 mg

## 2022-08-18 MED ORDER — EPHEDRINE 5 MG/ML INJ
INTRAVENOUS | Status: AC
  Filled 2022-08-18: qty 5

## 2022-08-18 MED ORDER — MIDAZOLAM HCL 2 MG/2ML IJ SOLN
INTRAMUSCULAR | Status: AC
  Administered 2022-08-18: 2 mg
  Filled 2022-08-18: qty 2

## 2022-08-18 SURGICAL SUPPLY — 71 items
AID PSTN UNV HD RSTRNT DISP (MISCELLANEOUS) ×1
ANCHOR NDL 9/16 CIR SZ 8 (NEEDLE) IMPLANT
ANCHOR NEEDLE 9/16 CIR SZ 8 (NEEDLE) IMPLANT
ANCHOR PEEK 4.75X19.1 SWLK C (Anchor) IMPLANT
ANCHOR PEEK SWIVEL LOCK 5.5 (Anchor) IMPLANT
ANCHOR SWIVELOCK BIO 4.75X19.1 (Anchor) IMPLANT
BAG COUNTER SPONGE SURGICOUNT (BAG) IMPLANT
BAG SPNG CNTER NS LX DISP (BAG)
BLADE SHAVER TORPEDO 4X13 (MISCELLANEOUS) ×2 IMPLANT
BLADE SURG SZ11 CARB STEEL (BLADE) ×2 IMPLANT
BURR OVAL 8 FLU 4.0X13 (MISCELLANEOUS) IMPLANT
CANNULA 5.75X7 CRYSTAL CLEAR (CANNULA) IMPLANT
CANNULA ACUFO 5X76 (CANNULA) IMPLANT
CLEANER TIP ELECTROSURG 2X2 (MISCELLANEOUS) IMPLANT
COVER SURGICAL LIGHT HANDLE (MISCELLANEOUS) ×2 IMPLANT
DRAPE IMP U-DRAPE 54X76 (DRAPES) ×2 IMPLANT
DRAPE ORTHO SPLIT 77X108 STRL (DRAPES) ×2
DRAPE POUCH INSTRU U-SHP 10X18 (DRAPES) ×2 IMPLANT
DRAPE STERI 35X30 U-POUCH (DRAPES) ×2 IMPLANT
DRAPE SURG ORHT 6 SPLT 77X108 (DRAPES) ×4 IMPLANT
DRSG AQUACEL AG ADV 3.5X 4 (GAUZE/BANDAGES/DRESSINGS) IMPLANT
DRSG AQUACEL AG ADV 3.5X 6 (GAUZE/BANDAGES/DRESSINGS) IMPLANT
DURAPREP 26ML APPLICATOR (WOUND CARE) ×2 IMPLANT
DW OUTFLOW CASSETTE/TUBE SET (MISCELLANEOUS) ×2 IMPLANT
ELECT NDL TIP 2.8 STRL (NEEDLE) ×2 IMPLANT
ELECT NEEDLE TIP 2.8 STRL (NEEDLE) ×1 IMPLANT
ELECT REM PT RETURN 15FT ADLT (MISCELLANEOUS) ×2 IMPLANT
FILTER STRAW (MISCELLANEOUS) ×2 IMPLANT
GAUZE PAD ABD 8X10 STRL (GAUZE/BANDAGES/DRESSINGS) IMPLANT
GAUZE SPONGE 4X4 12PLY STRL (GAUZE/BANDAGES/DRESSINGS) IMPLANT
GLOVE BIOGEL PI IND STRL 7.0 (GLOVE) ×2 IMPLANT
GLOVE BIOGEL PI IND STRL 8 (GLOVE) ×2 IMPLANT
GLOVE SURG SS PI 8.0 STRL IVOR (GLOVE) ×4 IMPLANT
GOWN STRL REUS W/ TWL XL LVL3 (GOWN DISPOSABLE) ×4 IMPLANT
GOWN STRL REUS W/TWL XL LVL3 (GOWN DISPOSABLE) ×2
KIT BASIN OR (CUSTOM PROCEDURE TRAY) ×2 IMPLANT
KIT TURNOVER KIT A (KITS) IMPLANT
MANIFOLD NEPTUNE II (INSTRUMENTS) ×2 IMPLANT
NDL SCORPION MULTI FIRE (NEEDLE) IMPLANT
NDL SPNL 18GX3.5 QUINCKE PK (NEEDLE) ×2 IMPLANT
NEEDLE SCORPION MULTI FIRE (NEEDLE) IMPLANT
NEEDLE SPNL 18GX3.5 QUINCKE PK (NEEDLE) ×1 IMPLANT
PACK SHOULDER (CUSTOM PROCEDURE TRAY) ×2 IMPLANT
PORT APPOLLO RF 90DEGREE MULTI (SURGICAL WAND) ×2 IMPLANT
PROTECTOR NERVE ULNAR (MISCELLANEOUS) ×2 IMPLANT
RESTRAINT HEAD UNIVERSAL NS (MISCELLANEOUS) ×2 IMPLANT
SLING ARM IMMOBILIZER LRG (SOFTGOODS) IMPLANT
SLING ARM IMMOBILIZER MED (SOFTGOODS) IMPLANT
SLING ULTRA II L (ORTHOPEDIC SUPPLIES) IMPLANT
STRIP CLOSURE SKIN 1/2X4 (GAUZE/BANDAGES/DRESSINGS) IMPLANT
SUCTION FRAZIER HANDLE 12FR (TUBING) ×1
SUCTION TUBE FRAZIER 12FR DISP (TUBING) ×2 IMPLANT
SUT ETHIBOND NAB CT1 #1 30IN (SUTURE) IMPLANT
SUT ETHILON 4 0 PS 2 18 (SUTURE) ×2 IMPLANT
SUT FIBERWIRE #2 38 T-5 BLUE (SUTURE)
SUT PROLENE 3 0 PS 2 (SUTURE) ×2 IMPLANT
SUT TIGER TAPE 7 IN WHITE (SUTURE) IMPLANT
SUT VIC AB 0 CT1 27 (SUTURE) ×1
SUT VIC AB 0 CT1 27XBRD ANTBC (SUTURE) ×2 IMPLANT
SUT VIC AB 1-0 CT2 27 (SUTURE) IMPLANT
SUT VIC AB 2-0 CT2 27 (SUTURE) IMPLANT
SUT VICRYL 0 UR6 27IN ABS (SUTURE) ×2 IMPLANT
SUTURE FIBERWR #2 38 T-5 BLUE (SUTURE) IMPLANT
SYR 20ML LL LF (SYRINGE) ×2 IMPLANT
TAPE CLOTH 3X10 WHT NS LF (GAUZE/BANDAGES/DRESSINGS) IMPLANT
TAPE FIBER 2MM 7IN #2 BLUE (SUTURE) IMPLANT
TOWEL OR 17X26 10 PK STRL BLUE (TOWEL DISPOSABLE) ×2 IMPLANT
TUBING ARTHROSCOPY IRRIG 16FT (MISCELLANEOUS) ×2 IMPLANT
TUBING CONNECTING 10 (TUBING) ×4 IMPLANT
WAND ABLATOR APOLLO I90 (BUR) IMPLANT
WIPE CHG 2% PREP (PERSONAL CARE ITEMS) ×2 IMPLANT

## 2022-08-18 NOTE — Brief Op Note (Signed)
08/18/2022  9:28 AM  PATIENT:  Shelby Brandt  67 y.o. female  PRE-OPERATIVE DIAGNOSIS:  Left shoulder impingement, rotator cuff tear, acromioclavicular arthritis  POST-OPERATIVE DIAGNOSIS:  * No post-op diagnosis entered *   DIAGNOSES: Left shoulder, acute traumatic rotator cuff tear, AC arthritis, and subacromial impingement.  POST-OPERATIVE DIAGNOSIS:  Same including anterior labral tear  PROCEDURE: Arthroscopic extensive debridement - 29823 Subdeltoid Bursa, Supraspinatus Tendon, and Anterior Labrum Arthroscopic distal clavicle excision [id number] Arthroscopic subacromial decompression - 29826 Partial distal clavicle resection   OPERATIVE FINDING: Exam under anesthesia: Normal Articular space: Normal Chondral surfaces: Normal Biceps: Normal Subscapularis: Intact  Supraspinatus: Incomplete tear 30% Infraspinatus: Intact    SURGEON:  Surgeon(s) and Role:    Susa Day, MD - Primary  PHYSICIAN ASSISTANT:   ASSISTANTS: [place]   ANESTHESIA:   general  EBL:  min   BLOOD ADMINISTERED:none  DRAINS: none   LOCAL MEDICATIONS USED:  MARCAINE     SPECIMEN:  No Specimen  DISPOSITION OF SPECIMEN:  N/A  COUNTS:  YES  TOURNIQUET:    DICTATION: .Other Dictation: Dictation Number [id number]  PLAN OF CARE: Discharge to home after PACU  PATIENT DISPOSITION:  PACU - hemodynamically stable.   Delay start of Pharmacological VTE agent (>24hrs) due to surgical blood loss or risk of bleeding: no

## 2022-08-18 NOTE — Anesthesia Procedure Notes (Signed)
Anesthesia Regional Block: Interscalene brachial plexus block   Pre-Anesthetic Checklist: , timeout performed,  Correct Patient, Correct Site, Correct Laterality,  Correct Procedure, Correct Position, site marked,  Risks and benefits discussed,  Surgical consent,  Pre-op evaluation,  At surgeon's request and post-op pain management  Laterality: Upper and Left  Prep: Maximum Sterile Barrier Precautions used, chloraprep       Needles:  Injection technique: Single-shot  Needle Type: Echogenic Needle     Needle Length: 5cm  Needle Gauge: 21     Additional Needles:   Procedures:,,,, ultrasound used (permanent image in chart),,    Narrative:  Start time: 08/18/2022 9:21 AM End time: 08/18/2022 9:27 AM Injection made incrementally with aspirations every 5 mL.  Performed by: Personally  Anesthesiologist: Barnet Glasgow, MD  Additional Notes: Block assessed prior to procedure. Patient tolerated procedure well.

## 2022-08-18 NOTE — Interval H&P Note (Signed)
History and Physical Interval Note:  08/18/2022 9:27 AM  Shelby Brandt  has presented today for surgery, with the diagnosis of Left shoulder impingement, rotator cuff tear, acromioclavicular arthritis.  The various methods of treatment have been discussed with the patient and family. After consideration of risks, benefits and other options for treatment, the patient has consented to  Procedure(s): Left shoulder arthroscopy, subacromial decompression, possible distal clavicle resection, possible mini open rotator cuff repair (Left) as a surgical intervention.  The patient's history has been reviewed, patient examined, no change in status, stable for surgery.  I have reviewed the patient's chart and labs.  Questions were answered to the patient's satisfaction.     Johnn Hai

## 2022-08-18 NOTE — Anesthesia Procedure Notes (Signed)
Procedure Name: Intubation Date/Time: 08/18/2022 10:12 AM  Performed by: Victoriano Lain, CRNAPre-anesthesia Checklist: Patient identified, Emergency Drugs available, Suction available, Patient being monitored and Timeout performed Patient Re-evaluated:Patient Re-evaluated prior to induction Oxygen Delivery Method: Circle system utilized Preoxygenation: Pre-oxygenation with 100% oxygen Induction Type: IV induction Ventilation: Mask ventilation without difficulty Laryngoscope Size: Mac and 4 Grade View: Grade I Tube type: Oral Tube size: 7.5 mm Number of attempts: 1 Airway Equipment and Method: Stylet Placement Confirmation: ETT inserted through vocal cords under direct vision, positive ETCO2 and breath sounds checked- equal and bilateral Secured at: 21 cm Tube secured with: Tape Dental Injury: Teeth and Oropharynx as per pre-operative assessment

## 2022-08-18 NOTE — Discharge Instructions (Signed)
SHOULDER ARTHROSCOPY POSTOPERATIVE INSTRUCTIONS FOR DR. Tonita Cong  1.  Ice pack on shoulder 3-4 times per day.  2.  May remove bandages in 72 hours and apply band-aids to sutures.  3,  May get out of sling in AM and start gentle pendulum exercises.  You are encouraged       to move the elbow, wrist and hand.  4.  Elevate operative shoulder and elbow on pillow.  5.  Exercise your fingers to help reduce swelling.  6.  Report to your doctor should any of the following situations occur:   -Swelling of your fingers.  -Inability to wiggle your fingers.  -Coldness, turning pale or blueness of your fingers.  -Loss of sensation, numbness or tingling of your fingers.  -Unusual small or odor from under dressing.  -Excessive bleeding or drainage from the surgical site(s).  -Severe pain which is not relieved by the pain medication your doctor prescribed                for you.  7.  Call the office to schedule and appointment for 10-14 days post-op . 8.  Take one aspirin per day 392m with a meal if not on another blood thinner or allergic.  Patient Signature:  ________________________________________________________  Nurse's Signature:  ________________________________________________________

## 2022-08-18 NOTE — Transfer of Care (Signed)
Immediate Anesthesia Transfer of Care Note  Patient: Shelby Brandt  Procedure(s) Performed: Left shoulder arthroscopy, subacromial decompression, possible distal clavicle resection, possible mini open rotator cuff repair (Left)  Patient Location: PACU  Anesthesia Type:General  Level of Consciousness: awake, alert , oriented, and patient cooperative  Airway & Oxygen Therapy: Patient Spontanous Breathing and Patient connected to face mask oxygen  Post-op Assessment: Report given to RN, Post -op Vital signs reviewed and stable, and Patient moving all extremities  Post vital signs: Reviewed and stable  Last Vitals:  Vitals Value Taken Time  BP    Temp    Pulse    Resp    SpO2      Last Pain:  Vitals:   08/18/22 0759  TempSrc:   PainSc: 5       Patients Stated Pain Goal: 4 ([id number])  Complications: No notable events documented.

## 2022-08-18 NOTE — Op Note (Signed)
Shelby Brandt, OLIVERIA MEDICAL RECORD NO: [id number] ACCOUNT NO: [id number] DATE OF BIRTH: [birth year] FACILITY: [facility] LOCATION: [facility] PHYSICIAN: Johnn Hai, MD  Operative Report   DATE OF PROCEDURE: 08/18/2022  PREOPERATIVE DIAGNOSES: 1.  Impingement syndrome, left shoulder. 2.  Partial tear rotator cuff, left shoulder. 3.  AC arthrosis.  POSTOPERATIVE DIAGNOSES:   1.  Impingement syndrome, left shoulder. 2.  Partial tear rotator cuff, left shoulder. 3.  AC arthrosis. 4. Anterior labral tear.  PROCEDURE PERFORMED:   1.  Left shoulder arthroscopy with extensive debridement including debridement of anterior labral tear, subacromial and subdeltoid bursitis, supraspinatus tendon. 2.  Partial distal clavicle resection. 3.  Acromioplasty and release of CA ligament.  ANESTHESIA:  General.  ASSISTANT:  Lacie Draft, PA.  HISTORY:  67 year old female impingement syndrome refractory to left shoulder, partial tear of the rotator cuff, AC arthrosis.  She had mainly impingement pain.  She was indicated for shoulder arthroscopy, subacromial decompression, evaluation of the  rotator cuff, debridement versus mini open repair, possible partial distal clavicle resection.  Risks and benefits discussed including bleeding, infection, damage to neurovascular structures, no change in symptoms, worsening symptoms, DVT, PE, anesthetic  complications, etc.  TECHNIQUE:  The patient in supine beach chair position, after induction of adequate anesthesia, 2 grams Kefzol, left shoulder was prepped and draped in the usual sterile fashion.  Prior to this exam under anesthesia revealed full range of motion.  Next, after appropriate timeout surgical marker was utilized delineate the acromion, AC joint and coracoid.  A standard posterolateral incision was made through the skin only with a #11 blade and an anterolateral incision for portals.  The arm in 70/30  position a cannula was advanced in the  glenohumeral joint, penetrating the capsule atraumatically.  Irrigant was utilized to insufflate the joint 35 mmHg.  Under direct visualization, an 18-gauge needle was utilized to fashion an anterior portal midway  between the coracoid and the anterolateral aspect of the acromion.  I then used a fashion to portal with a #11 blade and inserted the cannula beneath the biceps tendon and the glenohumeral joint.  Inspection revealed normal glenoid, normal humerus.   Biceps tendon was unremarkable.  A small tear of the anterior labrum was noted.  There was some partial tearing of the supraspinatus.  This was debrided approximately 1/2 cm in length with the shaver.  This was not a significant tear. The posterior and  superior labrum was unremarkable.  Biceps tendon was unremarkable.  Subscap was unremarkable.  I then redirected the camera in the subacromial space triangulating with the anterolateral portal cannula.  Hypertrophic bursa was noted in the subacromial  space.  I introduced the shaver and performed a full bursectomy in subdeltoid and subacromial.  Anterior and posterior partial tear of the rotator cuff was noted closer to the myotendinous junction approximately 1-2 mm in depth.  This was debrided  lightly.  Bleeding tissue was noted.  A hypertrophic and thickened CA ligament was noted.  An ArthroWand was utilized to release the CA ligament, preserving the deltoid attachments there.  A small spur off the anterolateral aspect of the acromion, which  was shaved with a bur.  Approximately 2 mm in depth x 1 cm.  Following this, the infraspinatus was unremarkable.  I then identified the inferior end of the distal clavicle.  There was an inferiorly projecting spur that appeared to be involved in the  impingement process at the myotendinous junction.  I used a bur to perform  a partial distal clavicle resection, acromioplasty, inferior spur up to flush with the acromion.  This was approximately 3 mm in depth.   Following this, there was ample space in  the acromiohumeral region.  Electrocautery was utilized to achieve strict hemostasis.  The tendon was probed at its insertion.  Infraspinatus supraspinatus at the myotendinous junction.  No high-grade tear was noted that would require repair.  I  therefore removed all instrumentation.  Portals were closed with 4-0 nylon simple sutures.  0.25% Marcaine with epinephrine was infiltrated in joint.  Wound was dressed sterilely.  Awoken without difficulty and transported to the recovery room in  satisfactory condition.  The patient tolerated the procedure well.  No complications.  ASSISTANT:  Lacie Draft, PA.  BLOOD LOSS:  Minimal.   PUS D: 08/18/2022 11:28:48 am T: 08/18/2022 11:44:00 am  JOB: [id number]

## 2022-08-19 ENCOUNTER — Encounter (HOSPITAL_COMMUNITY): Payer: Self-pay | Admitting: Specialist

## 2022-08-19 NOTE — Anesthesia Postprocedure Evaluation (Signed)
Anesthesia Post Note  Patient: Shelby Brandt  Procedure(s) Performed: Left shoulder arthroscopy, subacromial decompression, possible distal clavicle resection, possible mini open rotator cuff repair (Left)     Patient location during evaluation: PACU Anesthesia Type: General and Regional Level of consciousness: awake and alert Pain management: pain level controlled Vital Signs Assessment: post-procedure vital signs reviewed and stable Respiratory status: spontaneous breathing, nonlabored ventilation, respiratory function stable and patient connected to nasal cannula oxygen Cardiovascular status: blood pressure returned to baseline and stable Postop Assessment: no apparent nausea or vomiting Anesthetic complications: no  No notable events documented.  Last Vitals:  Vitals:   08/18/22 1200 08/18/22 1241  BP: 120/66 (!) 144/86  Pulse: 79 83  Resp: 19 15  Temp: 36.7 C 36.6 C  SpO2: 94% 94%    Last Pain:  Vitals:   08/18/22 1241  TempSrc:   PainSc: 0-No pain   Pain Goal: Patients Stated Pain Goal: 4 (08/18/22 0759)                 Barnet Glasgow

## 2022-09-02 ENCOUNTER — Ambulatory Visit (INDEPENDENT_AMBULATORY_CARE_PROVIDER_SITE_OTHER): Payer: Managed Care, Other (non HMO)

## 2022-09-02 ENCOUNTER — Ambulatory Visit: Payer: Managed Care, Other (non HMO) | Admitting: Podiatry

## 2022-09-02 DIAGNOSIS — M76822 Posterior tibial tendinitis, left leg: Secondary | ICD-10-CM | POA: Diagnosis not present

## 2022-09-02 DIAGNOSIS — M2141 Flat foot [pes planus] (acquired), right foot: Secondary | ICD-10-CM | POA: Diagnosis not present

## 2022-09-02 DIAGNOSIS — M778 Other enthesopathies, not elsewhere classified: Secondary | ICD-10-CM

## 2022-09-02 DIAGNOSIS — M76821 Posterior tibial tendinitis, right leg: Secondary | ICD-10-CM

## 2022-09-02 DIAGNOSIS — M7750 Other enthesopathy of unspecified foot: Secondary | ICD-10-CM

## 2022-09-02 DIAGNOSIS — M2142 Flat foot [pes planus] (acquired), left foot: Secondary | ICD-10-CM | POA: Diagnosis not present

## 2022-09-02 NOTE — Progress Notes (Signed)
Chief Complaint  Patient presents with   Tendonitis    NP I was diagnosed a few years ago with PTTD. Looking to avoid surgery and find a solution that allows me to walk, hike, etc.     HPI: 67 y.o. female presenting today as a new patient for evaluation and third opinion for posterior tibial tendinitis/PTTD of the bilateral lower extremities RT more symptomatic than the LT.  Patient states about 4 years ago she was seen at [facility] Dr. Doran Durand and diagnosed with PTTD.  She pursued physical therapy which did not help alleviate her symptoms and ultimately he recommended surgery.  Patient was not in a position to pursue surgery and ultimately she went to [facility] for second opinion.  During her time with [facility] and they recommended prefabricated OTC arch supports from [facility] and gave a cortisone injection which only helped temporarily.  She says that the prefabricated insoles do not fit in her work/dress shoes.  She continues to have pain and tenderness and presents for further treatment evaluation  Past Medical History:  Diagnosis Date   Arthritis    GERD (gastroesophageal reflux disease)    Hypertension    Hypothyroidism    Vision abnormalities    Past Surgical History:  Procedure Laterality Date   bilateral cataract surgery      KNEE ARTHROSCOPY Right 10/26/2012   Procedure: RIGHT KNEE ARTHROSCOPY WITH PARTIAL MEDIAL AND LATERAL  MENISECTOMY, DEBRIDEMENT;  Surgeon: Johnn Hai, MD;  Location: [facility];  Service: Orthopedics;  Laterality: Right;   MANDIBLE SURGERY  06/27/1992   and upper jaw   SHOULDER ARTHROSCOPY WITH ROTATOR CUFF REPAIR AND SUBACROMIAL DECOMPRESSION Left 08/18/2022   Procedure: Left shoulder arthroscopy, subacromial decompression, possible distal clavicle resection, possible mini open rotator cuff repair;  Surgeon: Susa Day, MD;  Location: [facility];  Service: Orthopedics;  Laterality: Left;   THYROIDECTOMY, PARTIAL  06/27/2008   right    TONSILLECTOMY     as adult   Allergies  Allergen Reactions   Ivp Dye [Iodinated Contrast Media] Hives    respiratory problems     Physical Exam: General: The patient is alert and oriented x3 in no acute distress.  Dermatology: Skin is warm, dry and supple bilateral lower extremities. Negative for open lesions or macerations.  Vascular: Palpable pedal pulses bilaterally. No edema or erythema noted. Capillary refill within normal limits.  Neurological: Epicritic and protective threshold grossly intact bilaterally.   Musculoskeletal Exam: Pain on palpation noted along posterior tibial tendon of the bilateral right greater than left lower extremity. Range of motion within normal limits. Muscle strength 5/5 in all muscle groups bilateral lower extremities.  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 Joint spaces preserved. No fracture or dislocation identified.  Moderate collapse of the medial longitudinal arch noted on lateral view with mild degenerative arthritic changes noted to the midtarsal joints right foot.  Assessment: 1. Posterior tibial tendinitis bilateral. RT > LT  -Patient evaluated.  X-rays reviewed -Today we discussed additional conservative treatment modalities.  I do believe it would be in the patient's best interest to pursue custom molded orthotics.  Recommend dress shoe type orthotic that will fit in her work shoes. 3/4 length.  Standard height arch support.  Appointment for custom molded orthotics casting here in our office -Unfortunate the patient states that she has history of stomach ulcers and does not take  NSAIDs.  Recommend Tylenol as needed -Advised against going barefoot.  Recommend good supportive shoes and sneakers -Return to clinic for orthotics fitting   Edrick Kins, DPM [facility]  Dr. Edrick Kins,  DPM    [address], [place] [address]                Office [phone]  Fax [phone]

## 2022-09-21 ENCOUNTER — Other Ambulatory Visit: Payer: Managed Care, Other (non HMO)

## 2022-09-21 DIAGNOSIS — M216X2 Other acquired deformities of left foot: Secondary | ICD-10-CM

## 2022-09-21 DIAGNOSIS — M216X1 Other acquired deformities of right foot: Secondary | ICD-10-CM | POA: Diagnosis not present

## 2022-10-11 ENCOUNTER — Telehealth: Payer: Self-pay | Admitting: Podiatry

## 2022-10-11 NOTE — Telephone Encounter (Signed)
Patient will call back to schedule picking up her orthotics once she has her work schedule.

## 2022-11-09 ENCOUNTER — Ambulatory Visit (INDEPENDENT_AMBULATORY_CARE_PROVIDER_SITE_OTHER): Payer: Managed Care, Other (non HMO)

## 2022-11-09 DIAGNOSIS — M2141 Flat foot [pes planus] (acquired), right foot: Secondary | ICD-10-CM

## 2022-11-09 DIAGNOSIS — M76821 Posterior tibial tendinitis, right leg: Secondary | ICD-10-CM

## 2022-11-09 DIAGNOSIS — M2142 Flat foot [pes planus] (acquired), left foot: Secondary | ICD-10-CM

## 2022-11-09 DIAGNOSIS — M76822 Posterior tibial tendinitis, left leg: Secondary | ICD-10-CM

## 2022-11-09 NOTE — Progress Notes (Signed)
Patient presents today to pick up custom molded foot orthotics recommended by Dr. Logan Bores.   Orthotics will be sent back for adjustments. While walking the patient noticed that she could feel the shell in her heel as well as the edge of the arch.

## 2023-08-23 ENCOUNTER — Other Ambulatory Visit: Payer: Self-pay | Admitting: Obstetrics and Gynecology

## 2023-08-23 DIAGNOSIS — Z1231 Encounter for screening mammogram for malignant neoplasm of breast: Secondary | ICD-10-CM

## 2023-08-31 ENCOUNTER — Ambulatory Visit
Admission: RE | Admit: 2023-08-31 | Discharge: 2023-08-31 | Disposition: A | Discharge status: Home / Self Care | Payer: Managed Care, Other (non HMO) | Source: Ambulatory Visit | Attending: Obstetrics and Gynecology | Admitting: Obstetrics and Gynecology

## 2023-08-31 DIAGNOSIS — Z1231 Encounter for screening mammogram for malignant neoplasm of breast: Secondary | ICD-10-CM

## 2024-08-01 ENCOUNTER — Encounter: Payer: Self-pay | Admitting: Family Medicine
